# Patient Record
Sex: Female | Born: 1965 | Race: White | Hispanic: No | Marital: Married | Smoking: Current every day smoker
Health system: Southern US, Community
[De-identification: ages and names within clinical notes are randomized; demographics above are authoritative.]

## PROBLEM LIST (undated history)

## (undated) DIAGNOSIS — K259 Gastric ulcer, unspecified as acute or chronic, without hemorrhage or perforation: Secondary | ICD-10-CM

## (undated) DIAGNOSIS — M797 Fibromyalgia: Secondary | ICD-10-CM

## (undated) DIAGNOSIS — G43909 Migraine, unspecified, not intractable, without status migrainosus: Secondary | ICD-10-CM

## (undated) DIAGNOSIS — I10 Essential (primary) hypertension: Secondary | ICD-10-CM

## (undated) HISTORY — DX: Gastric ulcer, unspecified as acute or chronic, without hemorrhage or perforation: K25.9

## (undated) HISTORY — DX: Essential (primary) hypertension: I10

---

## 2000-09-02 ENCOUNTER — Ambulatory Visit (HOSPITAL_COMMUNITY): Admission: RE | Admit: 2000-09-02 | Discharge: 2000-09-02 | Payer: Self-pay | Admitting: Obstetrics and Gynecology

## 2000-09-24 DIAGNOSIS — I10 Essential (primary) hypertension: Secondary | ICD-10-CM

## 2000-09-24 HISTORY — DX: Essential (primary) hypertension: I10

## 2001-09-23 ENCOUNTER — Other Ambulatory Visit: Admission: RE | Admit: 2001-09-23 | Discharge: 2001-09-23 | Payer: Self-pay | Admitting: Obstetrics and Gynecology

## 2002-09-24 HISTORY — PX: SALPINGOOPHORECTOMY: SHX82

## 2004-01-05 ENCOUNTER — Other Ambulatory Visit: Admission: RE | Admit: 2004-01-05 | Discharge: 2004-01-05 | Payer: Self-pay | Admitting: Obstetrics & Gynecology

## 2004-01-11 ENCOUNTER — Encounter: Admission: RE | Admit: 2004-01-11 | Discharge: 2004-01-11 | Payer: Self-pay | Admitting: Obstetrics & Gynecology

## 2004-02-15 ENCOUNTER — Ambulatory Visit (HOSPITAL_COMMUNITY): Admission: RE | Admit: 2004-02-15 | Discharge: 2004-02-15 | Payer: Self-pay | Admitting: Obstetrics & Gynecology

## 2005-06-06 ENCOUNTER — Ambulatory Visit: Payer: Self-pay | Admitting: Gastroenterology

## 2005-06-13 ENCOUNTER — Ambulatory Visit: Payer: Self-pay | Admitting: Gastroenterology

## 2005-06-22 ENCOUNTER — Ambulatory Visit (HOSPITAL_COMMUNITY): Admission: RE | Admit: 2005-06-22 | Discharge: 2005-06-22 | Payer: Self-pay | Admitting: Gastroenterology

## 2005-07-02 ENCOUNTER — Ambulatory Visit (HOSPITAL_COMMUNITY): Admission: RE | Admit: 2005-07-02 | Discharge: 2005-07-02 | Payer: Self-pay | Admitting: Gastroenterology

## 2005-07-24 ENCOUNTER — Ambulatory Visit: Payer: Self-pay | Admitting: Gastroenterology

## 2005-07-28 ENCOUNTER — Observation Stay (HOSPITAL_COMMUNITY): Admission: RE | Admit: 2005-07-28 | Discharge: 2005-07-28 | Payer: Self-pay | Admitting: General Surgery

## 2005-08-08 ENCOUNTER — Other Ambulatory Visit: Admission: RE | Admit: 2005-08-08 | Discharge: 2005-08-08 | Payer: Self-pay | Admitting: Obstetrics & Gynecology

## 2006-02-16 ENCOUNTER — Emergency Department (HOSPITAL_COMMUNITY): Admission: EM | Admit: 2006-02-16 | Discharge: 2006-02-16 | Payer: Self-pay | Admitting: Emergency Medicine

## 2006-06-12 ENCOUNTER — Ambulatory Visit (HOSPITAL_COMMUNITY): Admission: RE | Admit: 2006-06-12 | Discharge: 2006-06-13 | Payer: Self-pay | Admitting: Orthopaedic Surgery

## 2006-08-27 ENCOUNTER — Encounter: Admission: RE | Admit: 2006-08-27 | Discharge: 2006-08-27 | Payer: Self-pay | Admitting: Orthopaedic Surgery

## 2006-08-27 IMAGING — CT CT CERVICAL SPINE W/O CM
4 series · 16 of 33 positions shown, 19 images · IV contrast (agent unspecified)
Comparison: [REDACTED] intraoperative cervical spine radiographs [DATE].

CLINICAL DATA: Post fusion.  Possible pseudoarthrosis C5-6. 
CT CERVICAL SPINE WITHOUT CONTRAST:
TECHNIQUE: Multidetector CT imaging of the cervical spine was performed.  Multiplanar CT  image reconstructions were also generated.

[Series 2: cervical spine · axial · 0.23mm/px · z∈[+3,+121]mm · 5 of 142 slices shown, 7 images]
[im 24/142  soft-tissue]
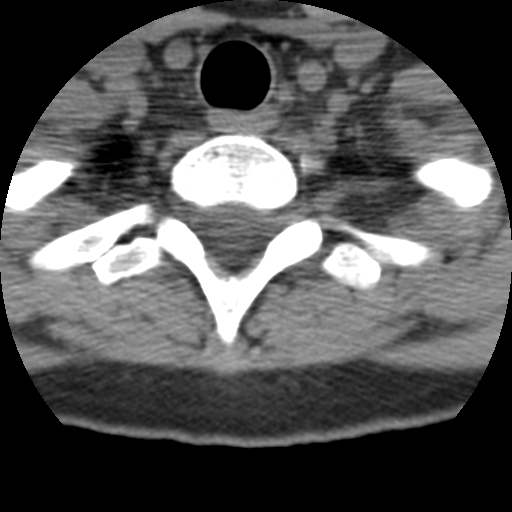
[im 24/142  bone]
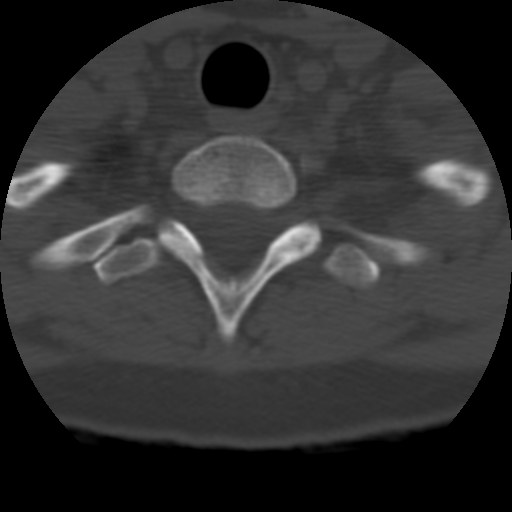
[im 48/142  bone]
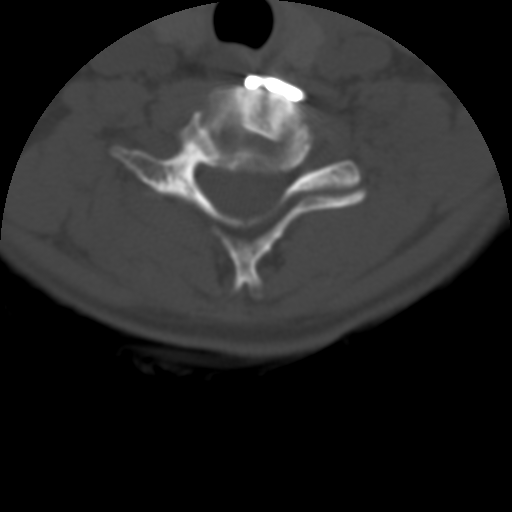
[im 71/142  bone]
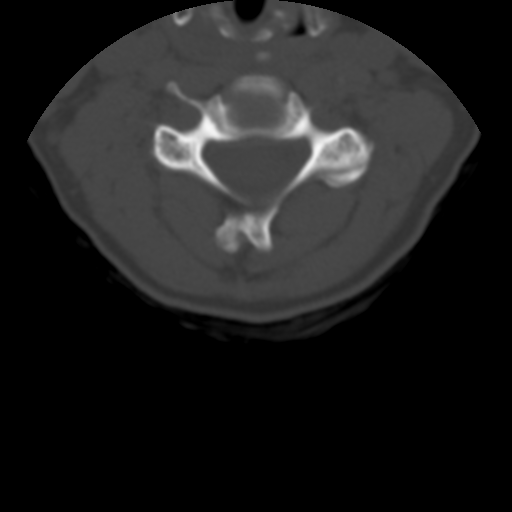
[im 95/142  bone]
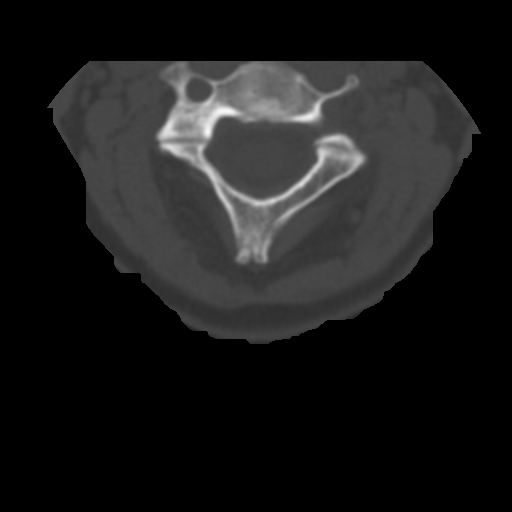
[im 118/142  soft-tissue]
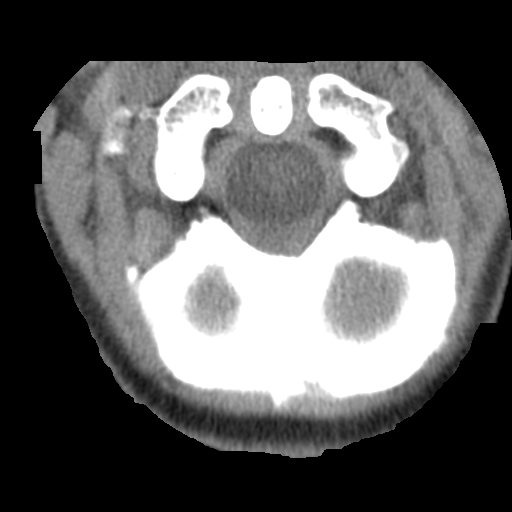
[im 118/142  bone]
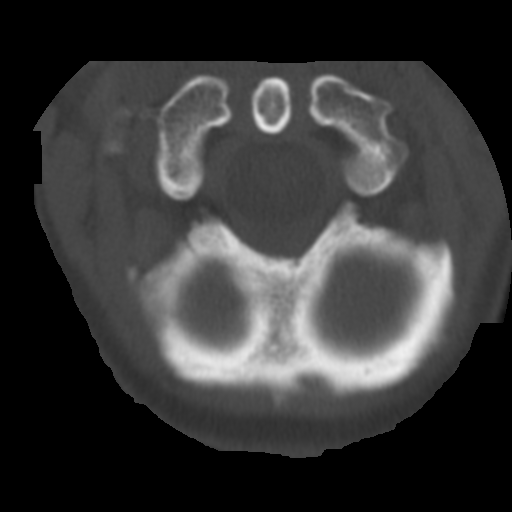

[Series 3: bone windows · axial · 0.23mm/px · z∈[+3,+62]mm · 3 of 142 slices shown]
[im 24/142  bone]
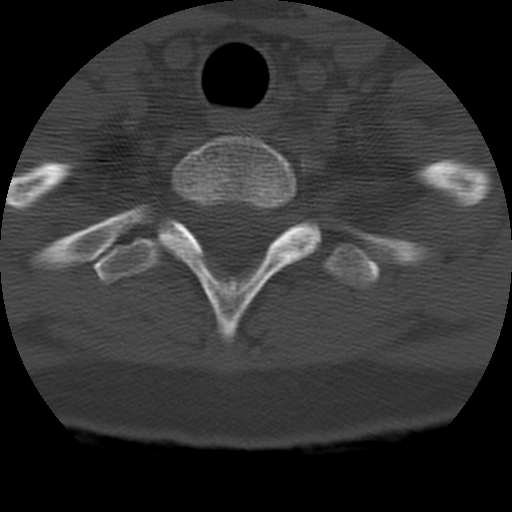
[im 48/142  bone]
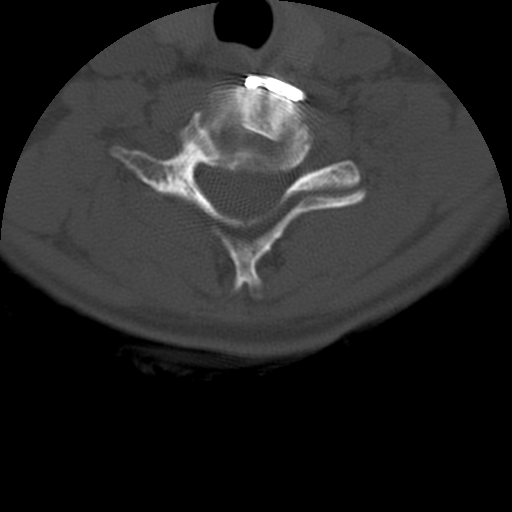
[im 71/142  bone]
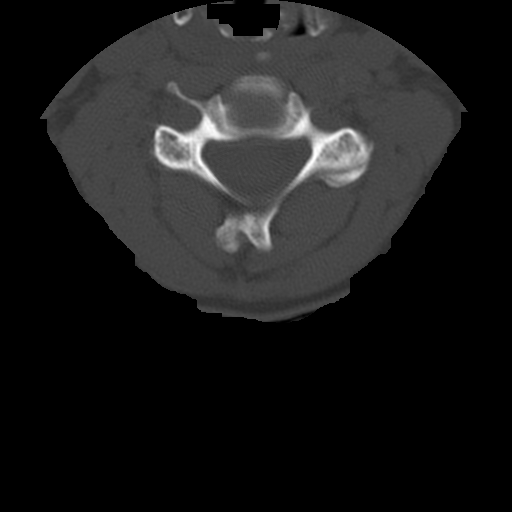

[Series 400: sag · sagittal · 0.35mm/px · 5 of 40 slices shown, 6 images]
[im 14/40  bone]
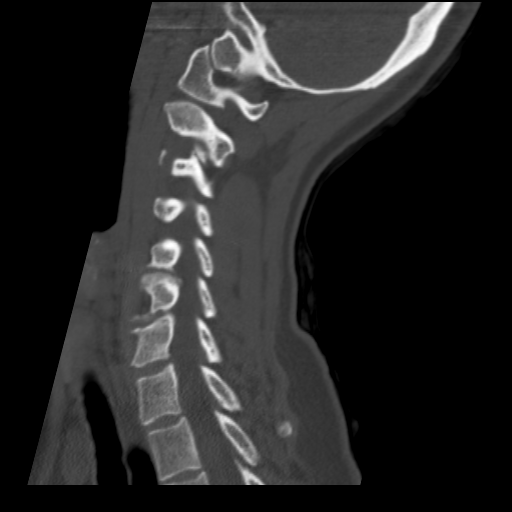
[im 17/40  bone]
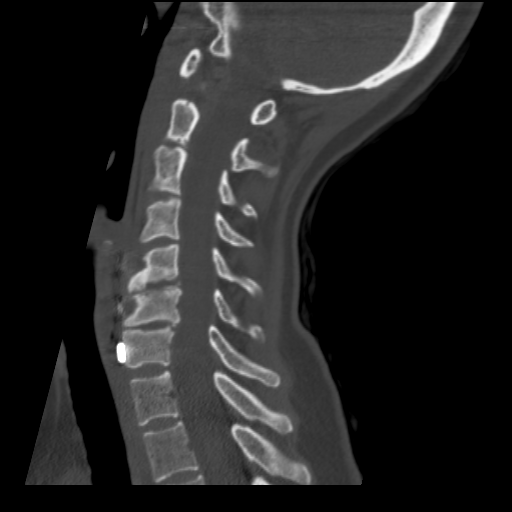
[im 20/40  soft-tissue]
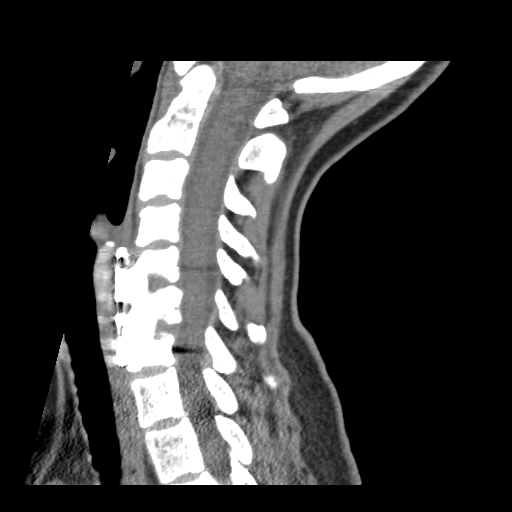
[im 20/40  bone]
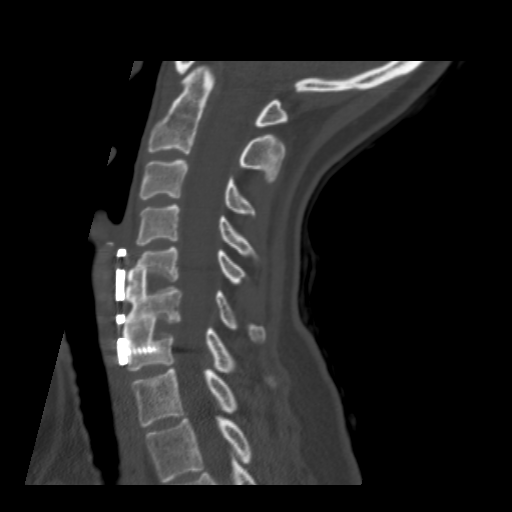
[im 23/40  bone]
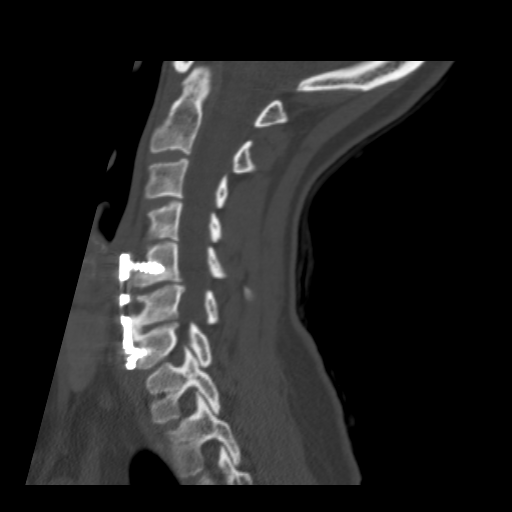
[im 27/40  bone]
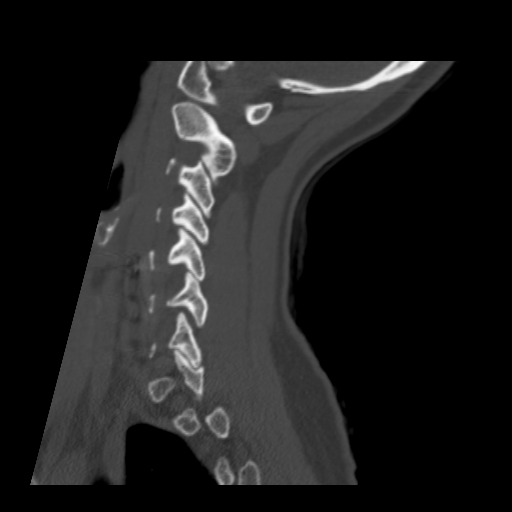

[Series 401: cor · coronal · 0.35mm/px · 3 of 40 slices shown]
[im 8/40  bone]
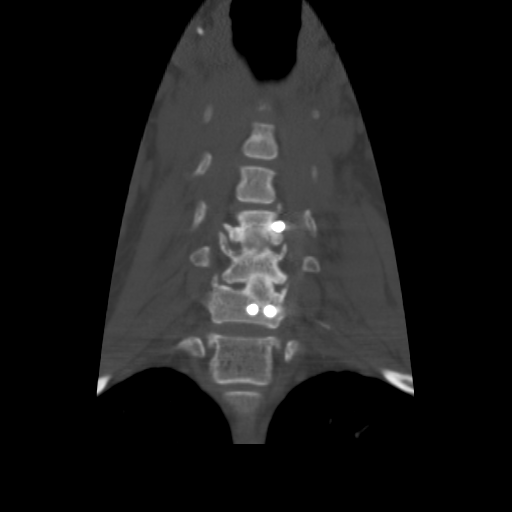
[im 16/40  bone]
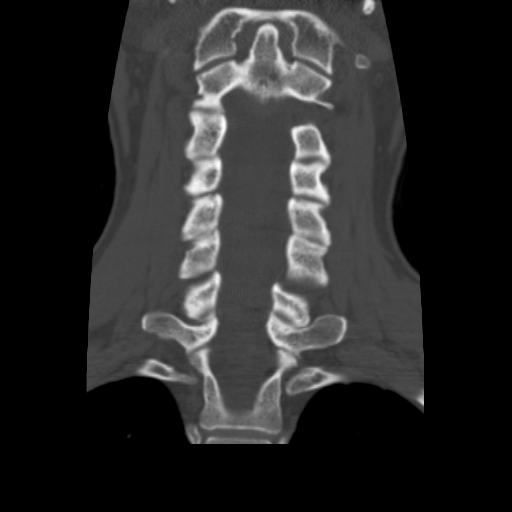
[im 24/40  bone]
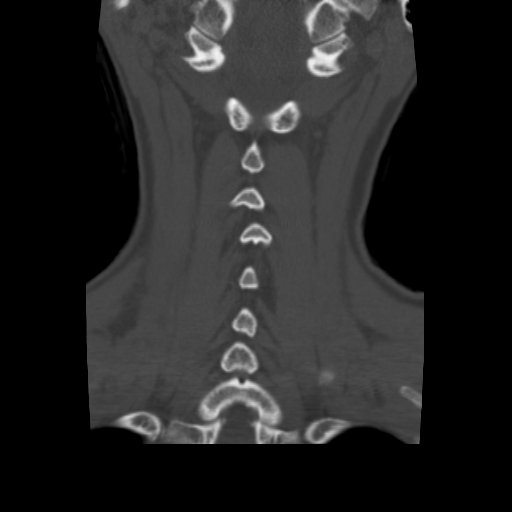

[16 of 33 positions shown; findings below may reference images not displayed]

FINDINGS: Base of the skull, C1-2, C2-3, C3-4, C4-5:  No significant neural impingement, acquired spinal stenosis, acquired spinal stenosis, nor significant disc bulge/herniation is seen.  
C5-6 and C6-7:  Anterior discectomy fusion changes demonstrate solid osseous fusion at the anterior aspect of the disc spaces with interbody bone plugs.  No surrounding osteolysis/loosening is seen at the two C7 and singular left C5 screws.  The anterior metal plate is flush with the anterior C7 vertebrae with up to 2mm gap at the anterior mid C6 and 4mm gap at the mid to superior C5 vertebrae.  The mid to posterior C5-6 and C6-7 discs are unfused.  AP dimension of the central spinal canal at the inferior C6 level measures 11mm (axial image 95).  No other neural impingement/acquired spinal stenosis is seen at these levels.  
C7-T1, T1-2, T2-3, no significant neural impingement nor acquired spinal stenosis is seen.
IMPRESSION: 1.  Anterior cervical discectomy post fusion change at C5-6 and C6-7 with osseous fusion of interbody bone plugs and anterior disc spaces.  Mid to posterior C5-6 and C6-7 disc spaces are unfused.  2mm gap at C6 and 4mm gap at the superior C5 vertebral body metal plate interface noted with no evidence for loosening of metal hardware screws. 
2.  Otherwise no significant abnormality.

## 2006-09-09 ENCOUNTER — Ambulatory Visit (HOSPITAL_COMMUNITY): Admission: RE | Admit: 2006-09-09 | Discharge: 2006-09-10 | Payer: Self-pay | Admitting: Orthopaedic Surgery

## 2007-09-25 DIAGNOSIS — G43909 Migraine, unspecified, not intractable, without status migrainosus: Secondary | ICD-10-CM

## 2007-09-25 DIAGNOSIS — M797 Fibromyalgia: Secondary | ICD-10-CM

## 2007-09-25 HISTORY — DX: Fibromyalgia: M79.7

## 2007-09-25 HISTORY — PX: CERVICAL FUSION: SHX112

## 2007-09-25 HISTORY — PX: CHOLECYSTECTOMY: SHX55

## 2007-09-25 HISTORY — DX: Migraine, unspecified, not intractable, without status migrainosus: G43.909

## 2009-07-15 ENCOUNTER — Emergency Department: Payer: Self-pay | Admitting: Emergency Medicine

## 2011-11-25 ENCOUNTER — Emergency Department (HOSPITAL_COMMUNITY)
Admission: EM | Admit: 2011-11-25 | Discharge: 2011-11-25 | Disposition: A | Discharge status: Home / Self Care | Payer: Self-pay | Attending: Emergency Medicine | Admitting: Emergency Medicine

## 2011-11-25 ENCOUNTER — Encounter (HOSPITAL_COMMUNITY): Payer: Self-pay

## 2011-11-25 DIAGNOSIS — R112 Nausea with vomiting, unspecified: Secondary | ICD-10-CM | POA: Insufficient documentation

## 2011-11-25 DIAGNOSIS — R51 Headache: Secondary | ICD-10-CM

## 2011-11-25 DIAGNOSIS — F17200 Nicotine dependence, unspecified, uncomplicated: Secondary | ICD-10-CM | POA: Insufficient documentation

## 2011-11-25 HISTORY — DX: Migraine, unspecified, not intractable, without status migrainosus: G43.909

## 2011-11-25 HISTORY — DX: Fibromyalgia: M79.7

## 2011-11-25 MED ORDER — MORPHINE SULFATE 4 MG/ML IJ SOLN
4.0000 mg | Freq: Once | INTRAMUSCULAR | Status: AC
  Administered 2011-11-25: 4 mg via INTRAVENOUS
  Filled 2011-11-25: qty 1

## 2011-11-25 MED ORDER — OXYCODONE-ACETAMINOPHEN 5-325 MG PO TABS: 1.0000 | ORAL_TABLET | Freq: Four times a day (QID) | ORAL | Status: AC | PRN

## 2011-11-25 MED ORDER — METOCLOPRAMIDE HCL 5 MG/ML IJ SOLN
10.0000 mg | Freq: Once | INTRAMUSCULAR | Status: AC
  Administered 2011-11-25: 10 mg via INTRAVENOUS
  Filled 2011-11-25: qty 2

## 2011-11-25 MED ORDER — SODIUM CHLORIDE 0.9 % IV BOLUS (SEPSIS)
1000.0000 mL | Freq: Once | INTRAVENOUS | Status: AC
  Administered 2011-11-25: 1000 mL via INTRAVENOUS

## 2011-11-25 MED ORDER — KETOROLAC TROMETHAMINE 30 MG/ML IJ SOLN
30.0000 mg | Freq: Once | INTRAMUSCULAR | Status: AC
  Administered 2011-11-25: 30 mg via INTRAVENOUS
  Filled 2011-11-25: qty 1

## 2011-11-25 NOTE — ED Notes (Signed)
Pt presents with "Migraine" and vomiting since last night.

## 2011-11-25 NOTE — ED Provider Notes (Signed)
History   This chart was scribed for Benny Lennert, MD by Charolett Bumpers . The patient was seen in room APA12/APA12 and the patient's care was started at 7:52pm.   CSN: [id number]  Arrival date & time 11/25/11  1903   First MD Initiated Contact with Patient 11/25/11 1951      Chief Complaint  Patient presents with  . Migraine  . Emesis    (Consider location/radiation/quality/duration/timing/severity/associated sxs/prior Treatment) Shelly Owens is a 46 y.o. female who presents to the Emergency Department complaining of constant, severe headache with associated nausea and vomiting since last night. Patient states that she has had similar episodes previously, with her last episode being about 5 years ago.   Patient is a 46 y.o. female presenting with headaches. The history is provided by the patient.  Headache  This is a new problem. The current episode started yesterday. The problem occurs constantly. The problem has not changed since onset.The headache is associated with nothing. The quality of the pain is described as sharp. The pain is severe. The pain does not radiate. Associated symptoms include nausea and vomiting. She has tried nothing for the symptoms.    Past Medical History  Diagnosis Date  . Fibromyalgia   . Migraines     Past Surgical History  Procedure Date  . Cholecystectomy   . Salpingoophorectomy   . Cervical fusion     No family history on file.  History  Substance Use Topics  . Smoking status: Current Everyday Smoker -- 1.0 packs/day  . Smokeless tobacco: Not on file  . Alcohol Use: No    OB History    Grav Para Term Preterm Abortions TAB SAB Ect Mult Living                  Review of Systems  Constitutional: Negative for fatigue.  HENT: Negative for congestion, sinus pressure and ear discharge.   Eyes: Negative for discharge.  Respiratory: Negative for cough.   Cardiovascular: Negative for chest pain.  Gastrointestinal: Positive for  nausea and vomiting. Negative for abdominal pain and diarrhea.  Genitourinary: Negative for frequency and hematuria.  Musculoskeletal: Negative for back pain.  Skin: Negative for rash.  Neurological: Positive for headaches. Negative for seizures.  Hematological: Negative.   Psychiatric/Behavioral: Negative for hallucinations.  All other systems reviewed and are negative.    Allergies  Codeine; Dilaudid; and Phenergan  Home Medications  No current outpatient prescriptions on file.  BP 109/85  Pulse 84  Temp(Src) 97.9 F (36.6 C) (Oral)  Resp 22  Ht 5\' 2"  (1.575 m)  Wt 113 lb (51.256 kg)  BMI 20.67 kg/m2  SpO2 100%  Physical Exam  Constitutional: She is oriented to person, place, and time. She appears well-developed.  HENT:  Head: Normocephalic and atraumatic.  Eyes: EOM are normal. Pupils are equal, round, and reactive to light. No scleral icterus.       Conjunctiva inflamed.    Neck: Normal range of motion. Neck supple. No thyromegaly present.  Cardiovascular: Normal rate, regular rhythm and normal heart sounds.  Exam reveals no gallop and no friction rub.   No murmur heard. Pulmonary/Chest: Effort normal and breath sounds normal. No stridor. She has no wheezes. She has no rales. She exhibits no tenderness.  Abdominal: Soft. Bowel sounds are normal. She exhibits no distension. There is no tenderness. There is no rebound.  Musculoskeletal: Normal range of motion. She exhibits no edema.  Lymphadenopathy:    She has no  cervical adenopathy.  Neurological: She is alert and oriented to person, place, and time. Coordination normal.  Skin: No rash noted. No erythema.  Psychiatric: She has a normal mood and affect. Her behavior is normal.    ED Course  Procedures (including critical care time)  DIAGNOSTIC STUDIES: Oxygen Saturation is 100% on room air, normal by my interpretation.    COORDINATION OF CARE:  1955: Discussed with patient the planned course of treatment.    2000: Medication Orders: Sodium chloride 0.9% bolus 1,000 mL-once; Morphine 4 mg/mL injection 4 mg-once; Ketorolac 30 mg/mL injection 30 mg-once; Metoclopramide injection 10 mg-once.  2105: Recheck: Patient states that her symptoms have improved.   Labs Reviewed - No data to display No results found.   No diagnosis found.    MDM  Migraine headache improved  The chart was scribed for me under my direct supervision.  I personally performed the history, physical, and medical decision making and all procedures in the evaluation of this patient.Benny Lennert, MD 11/25/11 2112

## 2011-11-25 NOTE — ED Notes (Signed)
Confirmed with EDP concerning giving Toradol, EDP states that pt did not have a true allergy

## 2012-01-18 ENCOUNTER — Encounter (HOSPITAL_COMMUNITY): Payer: Self-pay | Admitting: *Deleted

## 2012-01-18 ENCOUNTER — Emergency Department (HOSPITAL_COMMUNITY)
Admission: EM | Admit: 2012-01-18 | Discharge: 2012-01-18 | Disposition: A | Discharge status: Home / Self Care | Payer: Self-pay | Attending: Emergency Medicine | Admitting: Emergency Medicine

## 2012-01-18 DIAGNOSIS — T148XXA Other injury of unspecified body region, initial encounter: Secondary | ICD-10-CM

## 2012-01-18 DIAGNOSIS — S5010XA Contusion of unspecified forearm, initial encounter: Secondary | ICD-10-CM | POA: Insufficient documentation

## 2012-01-18 DIAGNOSIS — F17200 Nicotine dependence, unspecified, uncomplicated: Secondary | ICD-10-CM | POA: Insufficient documentation

## 2012-01-18 DIAGNOSIS — T50995A Adverse effect of other drugs, medicaments and biological substances, initial encounter: Secondary | ICD-10-CM | POA: Insufficient documentation

## 2012-01-18 DIAGNOSIS — G43909 Migraine, unspecified, not intractable, without status migrainosus: Secondary | ICD-10-CM | POA: Insufficient documentation

## 2012-01-18 NOTE — Discharge Instructions (Signed)

## 2012-01-18 NOTE — ED Provider Notes (Signed)
History     CSN: [id number]  Arrival date & time 01/18/12  4098   First MD Initiated Contact with Patient 01/18/12 [phone]      Chief Complaint  Patient presents with  . PPD Reading    (Consider location/radiation/quality/duration/timing/severity/associated sxs/prior treatment) HPI Comments: Presents for a PPD evaluation. The test was placed a local pharmacy on 01/15/12. She had red yesterday and it was read erroneously as positive. Presents for reevaluation.  Has area of bruising but has no induration.  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 No language interpreter was used.    Past Medical History  Diagnosis Date  . Fibromyalgia   . Migraines     Past Surgical History  Procedure Date  . Cholecystectomy   . Salpingoophorectomy   . Cervical fusion     No family history on file.  History  Substance Use Topics  . Smoking status: Current Everyday Smoker -- 1.0 packs/day  . Smokeless tobacco: Not on file  . Alcohol Use: No    OB History    Grav Para Term Preterm Abortions TAB SAB Ect Mult Living                  Review of Systems  Constitutional: Negative for fever, chills, activity change, appetite change and fatigue.  HENT: Negative for congestion, sore throat, rhinorrhea, neck pain and neck stiffness.   Respiratory: Negative for cough and shortness of breath.   Cardiovascular: Negative for chest pain and palpitations.  Gastrointestinal: Negative for nausea, vomiting and abdominal pain.  Genitourinary: Negative for dysuria, urgency, frequency and flank pain.  Musculoskeletal: Negative for myalgias, back pain and arthralgias.  Skin: Negative for color change and wound.  Neurological: Negative for dizziness, weakness, light-headedness, numbness and headaches.  All other systems reviewed and are negative.    Allergies  Aspirin; Codeine; Dilaudid; and Phenergan  Home Medications   Current Outpatient Rx    Name Route Sig Dispense Refill  . DIPHENHYDRAMINE HCL 25 MG PO TABS Oral Take 25 mg by mouth every 6 (six) hours as needed. For allergy    . ESTRADIOL 0.025 MG/24HR TD PTTW Transdermal Place 1 patch onto the skin 2 (two) times a week.    Marland Kitchen KETOPROFEN 75 MG PO CAPS Oral Take 75 mg by mouth 2 (two) times daily as needed. For migraines    . TRAMADOL HCL 50 MG PO TABS Oral Take 50 mg by mouth every 6 (six) hours as needed. For migraines      BP 146/92  Pulse 67  Temp(Src) 98.9 F (37.2 C) (Oral)  Resp 18  Wt 110 lb (49.896 kg)  SpO2 100%  Physical Exam  Nursing note and vitals reviewed. Constitutional: She is oriented to person, place, and time. She appears well-developed and well-nourished.  HENT:  Head: Normocephalic and atraumatic.  Mouth/Throat: Oropharynx is clear and moist.  Eyes: Conjunctivae and EOM are normal. Pupils are equal, round, and reactive to light.  Neck: Normal range of motion. Neck supple.  Cardiovascular: Normal rate, regular rhythm, normal heart sounds and intact distal pulses.  Exam reveals no gallop and no friction rub.   No murmur heard. Pulmonary/Chest: Effort normal and breath sounds normal. No respiratory distress. She exhibits no tenderness.  Abdominal: Soft. Bowel sounds are normal. There is no tenderness.  Musculoskeletal: Normal range of motion. She exhibits no tenderness.  Neurological: She is alert and oriented to person, place, and time.  Skin:       Present over the left forearm at the PPD site. There is no evidence of induration    ED Course  Procedures (including critical care time)  Labs Reviewed - No data to display No results found.   1. Contusion       MDM  PPD was read personally by me. There is no evidence of induration to suggest a positive reaction.  Consistent with a bruise.  No need for cxr or abx        Dayton Bailiff, MD 01/18/12 1006

## 2012-01-18 NOTE — ED Notes (Signed)
Pt states "I had a ppd test @ [facility], the pharmacist did it incorrectly and then recorded it was (+)."

## 2012-12-10 ENCOUNTER — Ambulatory Visit: Payer: Self-pay | Admitting: Adult Health

## 2012-12-30 ENCOUNTER — Ambulatory Visit: Payer: Self-pay | Admitting: Adult Health

## 2013-01-05 ENCOUNTER — Ambulatory Visit: Payer: Self-pay | Admitting: Adult Health

## 2013-01-08 ENCOUNTER — Ambulatory Visit (INDEPENDENT_AMBULATORY_CARE_PROVIDER_SITE_OTHER): Payer: Self-pay | Admitting: Adult Health

## 2013-01-08 ENCOUNTER — Encounter: Payer: Self-pay | Admitting: Adult Health

## 2013-01-08 VITALS — BP 112/82 | HR 62 | Temp 98.1°F | Resp 14 | Ht 62.5 in | Wt 123.5 lb

## 2013-01-08 DIAGNOSIS — Z0000 Encounter for general adult medical examination without abnormal findings: Secondary | ICD-10-CM

## 2013-01-08 DIAGNOSIS — Z1239 Encounter for other screening for malignant neoplasm of breast: Secondary | ICD-10-CM

## 2013-01-08 DIAGNOSIS — R5381 Other malaise: Secondary | ICD-10-CM

## 2013-01-08 DIAGNOSIS — R5383 Other fatigue: Secondary | ICD-10-CM

## 2013-01-08 NOTE — Progress Notes (Signed)
Subjective:    Patient ID: Shelly Owens, female    DOB: 1966-03-31, 47 y.o.   MRN: [id number]  HPI  Patient presents to clinic to establish care. Previously followed by Dr. Bethann Punches at [facility]. She has not seen a PCP in several years. She is feeling overall well at present. She reports having daily fatigue.   Past Medical History  Diagnosis Date  . Fibromyalgia 2009    Diagnosed by Dr. Loleta Chance  . Migraines 2009    Evaluated by Dr. Loleta Chance  . Gastric ulcer     As a child  . Hypertension 2002    Diagnosed by Dr. Hyacinth Meeker    Family History  Problem Relation Age of Onset  . Diabetes Mother   . COPD Mother   . Hypothyroidism Mother   . Diabetes Father   . Hypertension Sister   . Diabetes Sister   . Seizures Sister   . COPD Sister   . Hypothyroidism Sister   . Diabetes Brother 65    Complications from uncontrolled diabetes  . Alcohol abuse Brother   . Seizures Daughter     History   Social History  . Marital Status: Married    Spouse Name: Johnny    Number of Children: 3  . Years of Education: 14   Occupational History  . CNA     [employer]   Social History Main Topics  . Smoking status: Current Every Day Smoker -- 1.00 packs/day for 15 years    Types: Cigarettes  . Smokeless tobacco: Not on file  . Alcohol Use: No  . Drug Use: No  . Sexually Active: Yes   Other Topics Concern  . Not on file   Social History Narrative   Patient lives at home with her husband of 26 years, daughter and her fiance. 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 Patient has 3 adult children (2 sons, 1 dtr) and 2 grandchildren.     Health Maintenance:  Tdap - 2012  Flu shot - 06/2012  PAP - Abnormal in 2000. Did not follow up. GYN in [place]. She does not remember his name.  Mammography - 3 years ago. Needs  Dexa Scan - 3 years ago. Normal  Colonoscopy - N/A  Labs - 2-3 years ago. Will need  Depression Screen - Patient is  not depressed. She denies loss of interest in usual activities, feelings of hopelessness or anhedonia.  Tobacco Use - Current smoker 1ppd. Started at age 39. Quit intermittently for 14 years.   Dental Exams - Needs  Vision Exam - Needs  Exercise - 3 mile trail, zumba, swim in summer  Diet - She tries to follow a clean diet - Lean meats, healthy fats, fruits & vegetables    Review of Systems  Constitutional: Positive for appetite change and fatigue. Negative for fever and chills.       Weight gain 10 lbs within last 8 months  HENT: Positive for postnasal drip and tinnitus.        Tinnitus - ongoing for ~ 4-5 years.  Eyes: Negative for pain.       Burning eyes, straining  Respiratory: Positive for cough and shortness of breath. Negative for wheezing.   Cardiovascular: Positive for palpitations.       Hand and legs swell; palpitations related to anxiety  Gastrointestinal: Positive for diarrhea. Negative for nausea, vomiting, constipation, blood in stool and anal bleeding.  Diarrhea 2/2 cholecystectomy  Endocrine: Positive for cold intolerance and heat intolerance.       Hot flashes ~ 4 years  Genitourinary: Positive for pelvic pain and dyspareunia. Negative for dysuria, frequency, hematuria, vaginal bleeding, vaginal discharge and difficulty urinating.       Pain during intercourse but not afterward. Vaginal dryness  Musculoskeletal: Positive for back pain and arthralgias.  Skin: Negative for wound.       Dry skin  Allergic/Immunologic: Positive for environmental allergies.       Seasonal allergies. Takes claritin D  Neurological: Positive for weakness, numbness and headaches. Negative for seizures.       Left hand, pinky tingling  Hematological: Negative for adenopathy. Does not bruise/bleed easily.  Psychiatric/Behavioral: Positive for sleep disturbance. Negative for suicidal ideas, behavioral problems, confusion, self-injury and agitation. The patient is nervous/anxious.      BP 112/82  Pulse 62  Temp(Src) 98.1 F (36.7 C) (Oral)  Resp 14  Ht 5' 2.5" (1.588 m)  Wt 123 lb 8 oz (56.019 kg)  BMI 22.21 kg/m2  SpO2 95%    Objective:   Physical Exam  Constitutional: She is oriented to person, place, and time. She appears well-developed and well-nourished. No distress.  HENT:  Head: Normocephalic and atraumatic.  Right Ear: External ear normal.  Left Ear: External ear normal.  Nose: Nose normal.  Mouth/Throat: Oropharynx is clear and moist.  Eyes: Conjunctivae and EOM are normal. Pupils are equal, round, and reactive to light.  Neck: Normal range of motion. Neck supple.  Cardiovascular: Normal rate, regular rhythm, normal heart sounds and intact distal pulses.  Exam reveals no gallop and no friction rub.   No murmur heard. Pulmonary/Chest: Effort normal and breath sounds normal. No respiratory distress. She has no wheezes. She has no rales.  Abdominal: Soft. Bowel sounds are normal. She exhibits no distension and no mass. There is no tenderness. There is no rebound and no guarding.  Musculoskeletal: Normal range of motion. She exhibits no edema and no tenderness.  Lymphadenopathy:    She has no cervical adenopathy.  Neurological: She is alert and oriented to person, place, and time. No cranial nerve deficit. Coordination normal.  Skin: Skin is warm and dry.  Psychiatric: She has a normal mood and affect. Her behavior is normal. Judgment and thought content normal.          Assessment & Plan:  ]]]

## 2013-01-08 NOTE — Patient Instructions (Addendum)
  Thank you for choosing [facility] for your Health Care needs.  Please return in the next few days for your fasting labs.  Schedule your Pelvic including a PAP as soon as possible.  I have ordered your Mammogram. The office will contact you with an appoint.

## 2013-01-09 DIAGNOSIS — Z0000 Encounter for general adult medical examination without abnormal findings: Secondary | ICD-10-CM | POA: Insufficient documentation

## 2013-01-09 NOTE — Assessment & Plan Note (Signed)
Physical exam was normal. Will order labs: cbc w/diff, cmet, lipids, TSH. Patient will need to return for fasting labs. Also, she needs a return appointment for a PAP smear. She reports an abnormal without follow up. Will also order Mammogram.

## 2013-01-15 ENCOUNTER — Other Ambulatory Visit (INDEPENDENT_AMBULATORY_CARE_PROVIDER_SITE_OTHER): Payer: Self-pay

## 2013-01-15 DIAGNOSIS — R5383 Other fatigue: Secondary | ICD-10-CM

## 2013-01-15 DIAGNOSIS — Z0000 Encounter for general adult medical examination without abnormal findings: Secondary | ICD-10-CM

## 2013-01-15 DIAGNOSIS — R5381 Other malaise: Secondary | ICD-10-CM

## 2013-01-15 LAB — COMPREHENSIVE METABOLIC PANEL
Albumin: 4.2 g/dL (ref 3.5–5.2)
BUN: 22 mg/dL (ref 6–23)
CO2: 27 mEq/L (ref 19–32)
Calcium: 9.2 mg/dL (ref 8.4–10.5)
Chloride: 104 mEq/L (ref 96–112)
Creatinine, Ser: 1 mg/dL (ref 0.4–1.2)
GFR: 61.22 mL/min (ref >60.00)
Glucose, Bld: 99 mg/dL (ref 70–99)
Potassium: 4.6 mEq/L (ref 3.5–5.1)

## 2013-01-15 LAB — CBC WITH DIFFERENTIAL/PLATELET
Basophils Absolute: 0 10*3/uL (ref 0.0–0.1)
Basophils Relative: 1 % (ref 0.0–3.0)
Eosinophils Absolute: 0.1 10*3/uL (ref 0.0–0.7)
Eosinophils Relative: 2.9 % (ref 0.0–5.0)
HCT: 42.3 % (ref 36.0–46.0)
Lymphocytes Relative: 34.9 % (ref 12.0–46.0)
Lymphs Abs: 1.7 10*3/uL (ref 0.7–4.0)
MCHC: 34.1 g/dL (ref 30.0–36.0)
MCV: 94 fl (ref 78.0–100.0)
Monocytes Absolute: 0.4 10*3/uL (ref 0.1–1.0)
Monocytes Relative: 7.9 % (ref 3.0–12.0)
Neutro Abs: 2.7 10*3/uL (ref 1.4–7.7)
Neutrophils Relative %: 53.3 % (ref 43.0–77.0)
Platelets: 330 10*3/uL (ref 150.0–400.0)
RBC: 4.5 Mil/uL (ref 3.87–5.11)
RDW: 12.4 % (ref 11.5–14.6)
WBC: 5 10*3/uL (ref 4.5–10.5)

## 2013-01-15 LAB — LIPID PANEL
Cholesterol: 200 mg/dL (ref 0–200)
LDL Cholesterol: 121 mg/dL — ABNORMAL HIGH (ref 0–99)
Triglycerides: 135 mg/dL (ref 0.0–149.0)

## 2013-01-15 LAB — LDL CHOLESTEROL, DIRECT: Direct LDL: 120.2 mg/dL

## 2013-01-15 LAB — TSH: TSH: 1.73 u[IU]/mL (ref 0.35–5.50)

## 2013-01-20 ENCOUNTER — Ambulatory Visit: Payer: Self-pay | Admitting: Adult Health

## 2013-01-21 ENCOUNTER — Other Ambulatory Visit: Payer: Self-pay

## 2013-01-22 ENCOUNTER — Ambulatory Visit: Payer: Self-pay | Admitting: Adult Health

## 2013-02-09 ENCOUNTER — Ambulatory Visit (INDEPENDENT_AMBULATORY_CARE_PROVIDER_SITE_OTHER): Payer: BC Managed Care – PPO | Admitting: Adult Health

## 2013-02-09 ENCOUNTER — Encounter: Payer: Self-pay | Admitting: Adult Health

## 2013-02-09 ENCOUNTER — Other Ambulatory Visit (HOSPITAL_COMMUNITY)
Admission: RE | Admit: 2013-02-09 | Discharge: 2013-02-09 | Disposition: A | Discharge status: Home / Self Care | Payer: BC Managed Care – PPO | Source: Ambulatory Visit | Attending: Adult Health | Admitting: Adult Health

## 2013-02-09 ENCOUNTER — Other Ambulatory Visit: Payer: Self-pay | Admitting: *Deleted

## 2013-02-09 VITALS — BP 110/76 | HR 67 | Resp 12 | Wt 125.5 lb

## 2013-02-09 DIAGNOSIS — Z87898 Personal history of other specified conditions: Secondary | ICD-10-CM

## 2013-02-09 DIAGNOSIS — A499 Bacterial infection, unspecified: Secondary | ICD-10-CM

## 2013-02-09 DIAGNOSIS — B9689 Other specified bacterial agents as the cause of diseases classified elsewhere: Secondary | ICD-10-CM

## 2013-02-09 DIAGNOSIS — Z8742 Personal history of other diseases of the female genital tract: Secondary | ICD-10-CM

## 2013-02-09 DIAGNOSIS — Z1151 Encounter for screening for human papillomavirus (HPV): Secondary | ICD-10-CM | POA: Insufficient documentation

## 2013-02-09 DIAGNOSIS — Z01419 Encounter for gynecological examination (general) (routine) without abnormal findings: Secondary | ICD-10-CM | POA: Insufficient documentation

## 2013-02-09 DIAGNOSIS — Z124 Encounter for screening for malignant neoplasm of cervix: Secondary | ICD-10-CM

## 2013-02-09 DIAGNOSIS — N760 Acute vaginitis: Secondary | ICD-10-CM

## 2013-02-09 MED ORDER — METRONIDAZOLE 0.75 % VA GEL: VAGINAL | Status: DC

## 2013-02-09 MED ORDER — KETOPROFEN 50 MG PO CAPS: 50.0000 mg | ORAL_CAPSULE | Freq: Four times a day (QID) | ORAL | Status: DC | PRN

## 2013-02-09 MED ORDER — CLONAZEPAM 1 MG PO TABS: 1.0000 mg | ORAL_TABLET | Freq: Every evening | ORAL | Status: DC | PRN

## 2013-02-09 NOTE — Patient Instructions (Addendum)
  I will contact you with the results of your PAP and culture.  Start Metrogel vaginally daily x 5 days.

## 2013-02-09 NOTE — Addendum Note (Signed)
Addended by: Montine Circle D on: 02/09/2013 10:01 AM   Modules accepted: Orders

## 2013-02-09 NOTE — Telephone Encounter (Signed)
Refill

## 2013-02-09 NOTE — Progress Notes (Signed)
  Subjective:    Patient ID: Shelly Owens, female    DOB: August 23, 1966, 47 y.o.   MRN: [id number]  HPI  Patient is a pleasant 47 y/o female with hx of abnormal PAP in 2000 without follow up. She presents to clinic today for a PAP smear.     Current Outpatient Prescriptions on File Prior to Visit  Medication Sig Dispense Refill  . clonazePAM (KLONOPIN) 1 MG tablet Take 1 mg by mouth at bedtime as needed for anxiety.      Marland Kitchen ibuprofen (ADVIL,MOTRIN) 800 MG tablet Take 800 mg by mouth every 8 (eight) hours as needed for pain.      Marland Kitchen metoCLOPramide (REGLAN) 5 MG tablet Take 5 mg by mouth daily as needed.      Maxwell Caul Bicarbonate (ZEGERID) 20-1100 MG CAPS Take 1 capsule by mouth daily.      . traMADol (ULTRAM) 50 MG tablet Take 50 mg by mouth every 6 (six) hours as needed. For migraines      . zolmitriptan (ZOMIG-ZMT) 5 MG disintegrating tablet Take 5 mg by mouth as needed for migraine.      Marland Kitchen estradiol (VIVELLE-DOT) 0.05 MG/24HR Place 1 patch onto the skin 2 (two) times a week.       No current facility-administered medications on file prior to visit.     Review of Systems  Genitourinary: Negative for vaginal bleeding, vaginal discharge and dyspareunia.    Resp 12  Wt 125 lb 8 oz (56.926 kg)  BMI 22.57 kg/m2    Objective:   Physical Exam  Abdominal: Hernia confirmed negative in the right inguinal area and confirmed negative in the left inguinal area.  Genitourinary: Uterus normal.    No labial fusion. There is no rash, tenderness, lesion or injury on the right labia. There is no rash, tenderness, lesion or injury on the left labia. No erythema, tenderness or bleeding around the vagina. No foreign body around the vagina. Vaginal discharge found.       Assessment & Plan:

## 2013-02-09 NOTE — Addendum Note (Signed)
Addended by: Montine Circle D on: 02/09/2013 05:04 PM   Modules accepted: Orders

## 2013-02-09 NOTE — Assessment & Plan Note (Addendum)
Hx of abnormal PAP without follow up. PAP done today.

## 2013-02-09 NOTE — Assessment & Plan Note (Addendum)
PAP smear done. Culture also sent for suspected BV. Treat with metrogel daily x 5 days

## 2013-02-10 LAB — WET PREP BY MOLECULAR PROBE
Gardnerella vaginalis: NEGATIVE
Trichomonas vaginosis: NEGATIVE

## 2013-02-10 NOTE — Telephone Encounter (Signed)
Rx faxed to pharmacy  

## 2013-02-12 ENCOUNTER — Encounter: Payer: Self-pay | Admitting: *Deleted

## 2013-03-12 ENCOUNTER — Other Ambulatory Visit: Payer: Self-pay | Admitting: Adult Health

## 2013-03-13 NOTE — Telephone Encounter (Signed)
OK to refill

## 2013-04-08 ENCOUNTER — Other Ambulatory Visit: Payer: Self-pay | Admitting: Adult Health

## 2013-04-08 NOTE — Telephone Encounter (Signed)
Yes, ok to refill 

## 2013-04-08 NOTE — Telephone Encounter (Signed)
Ok to refill 

## 2013-05-04 ENCOUNTER — Other Ambulatory Visit: Payer: Self-pay | Admitting: *Deleted

## 2013-05-04 NOTE — Telephone Encounter (Signed)
I will refill at appt tomorrow.

## 2013-05-04 NOTE — Telephone Encounter (Signed)
Pt is needing refill on Tramadol.

## 2013-05-04 NOTE — Telephone Encounter (Signed)
Refill? Has appt tomorrow

## 2013-05-05 ENCOUNTER — Telehealth: Payer: Self-pay | Admitting: Adult Health

## 2013-05-05 ENCOUNTER — Ambulatory Visit: Payer: BC Managed Care – PPO | Admitting: Adult Health

## 2013-05-05 NOTE — Telephone Encounter (Signed)
Spoke with pt, has office visit scheduled for tomorrow. States takes Ketoprofen 1 tab every morning and requesting refill on Tramadol to help with her migraine.  Per Raquel, no refill on Tramadol until she sees her in the office tomorrow. Pt notified. Advised to take another Ketoprofen as Rx is written for 4 times daily prn.

## 2013-05-05 NOTE — Telephone Encounter (Signed)
Pt came at the wrong time today and says she is needing a refill on her Tramadol she is having bad migraines. Pt is scheduled for tomorrow at 3:30 pm

## 2013-05-06 ENCOUNTER — Encounter: Payer: Self-pay | Admitting: Adult Health

## 2013-05-06 ENCOUNTER — Encounter: Payer: Self-pay | Admitting: *Deleted

## 2013-05-06 ENCOUNTER — Ambulatory Visit (INDEPENDENT_AMBULATORY_CARE_PROVIDER_SITE_OTHER): Payer: Self-pay | Admitting: Adult Health

## 2013-05-06 VITALS — BP 128/80 | HR 60 | Temp 98.3°F | Resp 12 | Wt 124.0 lb

## 2013-05-06 DIAGNOSIS — G43909 Migraine, unspecified, not intractable, without status migrainosus: Secondary | ICD-10-CM

## 2013-05-06 MED ORDER — GABAPENTIN 100 MG PO CAPS: ORAL_CAPSULE | ORAL | Status: DC

## 2013-05-06 MED ORDER — KETOPROFEN 50 MG PO CAPS: ORAL_CAPSULE | ORAL | Status: DC

## 2013-05-06 MED ORDER — CLONAZEPAM 1 MG PO TABS: 1.0000 mg | ORAL_TABLET | Freq: Every evening | ORAL | Status: DC | PRN

## 2013-05-06 MED ORDER — TRAMADOL HCL 50 MG PO TABS: 50.0000 mg | ORAL_TABLET | Freq: Four times a day (QID) | ORAL | Status: DC | PRN

## 2013-05-06 MED ORDER — ELETRIPTAN HYDROBROMIDE 40 MG PO TABS: 40.0000 mg | ORAL_TABLET | ORAL | Status: DC | PRN

## 2013-05-06 NOTE — Progress Notes (Signed)
Subjective:    Patient ID: Shelly Owens, female    DOB: April 16, 1966, 47 y.o.   MRN: [id number]  HPI  Shelly Owens is a pleasant 47 y/o female with history of fibromyalgia, migraine headaches who presents to clinic with c/o daily headaches and pain "everwhere". Shelly Owens has been having these symptoms since 2008. Symptoms have not been well controlled. Shelly Owens has neck pain and is s/p cervical fusion (2009). Shelly Owens has been seen at the headache clinic in [place]. Shelly Owens underwent multiple treatment but Shelly Owens felt the medications were too strong and sedating. Shelly Owens that Shelly Owens has taken several medications for migraine prevention but side effects were too uncomfortable. Shelly Owens has been prescribed Topamax in the past but Shelly Owens became "spacey". Shelly Owens was also prescribed amitriptyline but this made her hallucinate. Her husband presents to clinic with her today and confirms [name]. Shelly Owens has been prescribed zomig in the past for migraines but this medication became too expensive. Shelly Owens also Owens waiting until HA was severe before taking which then, Shelly Owens found, would take longer to alleviate migraine. Shelly Owens is currently taking (per her report) ibuprofen OTC in addition to ketoprofen. Shelly Owens states that Shelly Owens had only been prescribed 30 tablets of ketoprofen even though the prescription read that Shelly Owens could take 4 times daily prn. So Shelly Owens was compensating by taking ibuprofen. Shelly Owens is also taking tamadol 1 tablet daily for her neck pain. In addition, Shelly Owens has also been taking BC powders.   Fadia first came to our office in 12/2012. All of the medications Shelly Owens was on were prescribed from a previous provider. Shelly Owens exercises daily but recently her headaches have been so debilitating that Shelly Owens has not been able to work out.  Current Outpatient Prescriptions on File Prior to Visit  Medication Sig Dispense Refill  . estradiol (VIVELLE-DOT) 0.05 MG/24HR Place 1 patch onto the skin 2 (two) times a week.      . metoCLOPramide (REGLAN) 5 MG tablet Take  5 mg by mouth daily as needed.      Maxwell Caul Bicarbonate (ZEGERID) 20-1100 MG CAPS Take 1 capsule by mouth daily.       No current facility-administered medications on file prior to visit.    Review of Systems  Constitutional: Positive for fatigue. Negative for fever and chills.  Respiratory: Negative.   Cardiovascular: Negative.   Musculoskeletal:       "pain everywhere".   Neurological: Positive for headaches. Negative for weakness and numbness.       Recently, having daily headache  Psychiatric/Behavioral: Negative for hallucinations, behavioral problems, confusion and agitation. The patient is nervous/anxious.        Objective:   Physical Exam  Constitutional: Shelly Owens is oriented to person, place, and time. Shelly Owens appears well-developed and well-nourished.  Patient crying  HENT:  Head: Normocephalic and atraumatic.  Right Ear: External ear normal.  Left Ear: External ear normal.  Mouth/Throat: Oropharynx is clear and moist.  Eyes: Conjunctivae and EOM are normal. Pupils are equal, round, and reactive to light.  Neck: Normal range of motion. Neck supple. No tracheal deviation present.  Cardiovascular: Normal rate, regular rhythm and intact distal pulses.  Exam reveals no gallop and no friction rub.   No murmur heard. Pulmonary/Chest: Effort normal and breath sounds normal. No respiratory distress. Shelly Owens has no rales. Shelly Owens exhibits no tenderness.  Abdominal: Soft. Bowel sounds are normal.  Musculoskeletal: Normal range of motion. Shelly Owens exhibits tenderness. Shelly Owens exhibits no edema.  Tenderness in palpating bilateral elbow joints. No edema. ROM  normal.  Neurological: Shelly Owens is alert and oriented to person, place, and time. Shelly Owens has normal reflexes.  Skin: Skin is warm and dry.  Psychiatric: Her behavior is normal. Judgment and thought content normal.  Crying throughout visit. Noted to be frustrated with her current situation of daily headaches.          Assessment & Plan:

## 2013-05-06 NOTE — Patient Instructions (Addendum)
  I am starting you on Neurontin (gabapentin) for migraine prevention. Start 100 mg at bedtime x 3 days. Then take 200 mg at bedtime.  Do not take ibuprofen, advil, aleve, motrin or BC powders.  I have refilled ketoprofen which is an anti-inflammatory.  I have also refilled your tramadol.  I have also given you samples of relpax for migraine headaches. Take 1 tablet at the first sign of a headache. You can repeat the dose in 2 hours if the migraine has not improved. You can also try taking only 1/2 the dose (1/2 tablet) to see if the lower dose will also work for you.  Keep a food diary and a diary of your migraines.  Please return in 2-3 weeks to evaluate how your headaches are doing.

## 2013-05-07 ENCOUNTER — Encounter: Payer: Self-pay | Admitting: Adult Health

## 2013-05-07 DIAGNOSIS — G43909 Migraine, unspecified, not intractable, without status migrainosus: Secondary | ICD-10-CM | POA: Insufficient documentation

## 2013-05-07 NOTE — Assessment & Plan Note (Addendum)
Patient is not currently on any migraine prevention. Given the degree of disability experienced with daily migraine, the focus needs to be on trying to find prophylactic treatment suitable for her. Discussed keeping a food diary so that she may be able to identify triggers. I will not start her on a beta blocker 2/2 her smoking and her low pulse. She appears to be very sensitive to medications so the dosages will initially be very low. We discussed the goal of therapy to be the lowest dose that will prevent migraines for her. She did not do well with topamax or amitriptyline. She is agreeable to try neurontin which may also help with her fibromyalgia symptoms. Start 100 mg at bedtime for 3 days then increase to 200 mg at bedtime. She will return for follow up in 2-3 weeks to discuss progress. I have also told her that she needs to stop taking BC powders. She has a hx of gastric ulcers and the [facility] powders are very harsh on the stomach lining. She also needs to avoid over use of OTC anti-inflammatories such as advil. I have explained to her that ketoprofen is a NSAID as well as the ibuprofen, advil etc. By taking too much NSAIDs she may be triggering rebound HA. I have provided her with samples of relpax and instructed to take at the onset of a migraine attack. If this helps her I will send in prescription. Note, greater than 40 min were spent in face to face communication with patient in the assessment, evaluation of previous tx, planning and implementation of care pertaining to this problem.

## 2013-05-14 ENCOUNTER — Other Ambulatory Visit: Payer: Self-pay | Admitting: Adult Health

## 2013-05-14 NOTE — Telephone Encounter (Signed)
Spoke with pharmacist, pt did not drop off written Rx from 05/06/13. Phoned in refill per Raquel Rey's note 05/06/13.

## 2013-05-18 ENCOUNTER — Encounter: Payer: Self-pay | Admitting: Adult Health

## 2013-05-20 ENCOUNTER — Ambulatory Visit: Payer: Self-pay | Admitting: Adult Health

## 2013-05-26 ENCOUNTER — Ambulatory Visit: Payer: Self-pay | Admitting: Adult Health

## 2013-06-16 ENCOUNTER — Telehealth: Payer: Self-pay | Admitting: *Deleted

## 2013-06-16 MED ORDER — TRAMADOL HCL 50 MG PO TABS: 50.0000 mg | ORAL_TABLET | Freq: Four times a day (QID) | ORAL | Status: DC | PRN

## 2013-06-16 NOTE — Telephone Encounter (Signed)
Pt called, stating [facility] [place] needed a new Rx for her Tramadol. Spoke with Brett Canales, pharmacist at [facility], since the change in Class of Tramadol, even though pt shows refills available, a new Rx needs to faxed in. Rx printed and given to Raquel for signature. Pt scheduled followup appointment 06/18/13.

## 2013-06-16 NOTE — Telephone Encounter (Signed)
Rx faxed and pt notified Rx was faxed.

## 2013-06-18 ENCOUNTER — Encounter: Payer: Self-pay | Admitting: Adult Health

## 2013-06-18 ENCOUNTER — Ambulatory Visit (INDEPENDENT_AMBULATORY_CARE_PROVIDER_SITE_OTHER): Payer: Self-pay | Admitting: Adult Health

## 2013-06-18 VITALS — BP 138/84 | HR 61 | Temp 97.8°F | Resp 12 | Wt 132.0 lb

## 2013-06-18 DIAGNOSIS — G43909 Migraine, unspecified, not intractable, without status migrainosus: Secondary | ICD-10-CM

## 2013-06-18 MED ORDER — DULOXETINE HCL 30 MG PO CPEP: 30.0000 mg | ORAL_CAPSULE | Freq: Two times a day (BID) | ORAL | Status: DC

## 2013-06-18 MED ORDER — ELETRIPTAN HYDROBROMIDE 40 MG PO TABS: 40.0000 mg | ORAL_TABLET | ORAL | Status: DC | PRN

## 2013-06-18 NOTE — Assessment & Plan Note (Addendum)
Continue Relpax for migraine abortive tx. Discontinue neurontin as patient did not tolerate. Start Cymbalta 30 mg daily x1 week. May increase to 60 mg daily starting week 2. We have not been able to find any medication that will help with her symptoms. If Cymbalta works discussed weaning her off other medications. She may also need referral to specialist for her multiple sites of pain. Return for followup in 2-3 months or sooner if necessary.

## 2013-06-18 NOTE — Patient Instructions (Addendum)
  Start Cymbalta 30 mg daily for 1 week. If this dose works for you then continue the 30 mg. If you feel you need a little more after one week then increase dose to 60 mg daily.  If this helps, we will begin to wean you off of the other medications.  Please call if you experience any problems or side effects.

## 2013-06-18 NOTE — Progress Notes (Signed)
  Subjective:    Patient ID: Shelly Owens, female    DOB: 09-Nov-1965, 47 y.o.   MRN: [id number]  HPI  Patient is a pleasant 47 year old female who presents to clinic for followup on migraine headaches. She was seen in clinic last month and started on Neurontin for preventative therapy. She did not do well with Neurontin. She reports side effects of feeling that she was walking on pebbles. Medication has been discontinued. She presents to clinic today and has an ongoing migraine. She has been using Relpax as abortive therapy. She still reports pain everywhere. She has not been able to return to the gym and is concerned because she is gaining weight.  Current Outpatient Prescriptions on File Prior to Visit  Medication Sig Dispense Refill  . clonazePAM (KLONOPIN) 1 MG tablet TAKE ONE TABLET BY MOUTH ONCE DAILY AT BEDTIME AS NEEDED FOR ANXIETY  30 tablet  2  . estradiol (VIVELLE-DOT) 0.05 MG/24HR Place 1 patch onto the skin 2 (two) times a week.      . ketoprofen (ORUDIS) 50 MG capsule Take 1 tablet twice a day as needed.  60 capsule  1  . metoCLOPramide (REGLAN) 5 MG tablet Take 5 mg by mouth daily as needed.      Maxwell Caul Bicarbonate (ZEGERID) 20-1100 MG CAPS Take 1 capsule by mouth daily.      . traMADol (ULTRAM) 50 MG tablet Take 1 tablet (50 mg total) by mouth every 6 (six) hours as needed. For migraines  30 tablet  2   No current facility-administered medications on file prior to visit.     Review of Systems  Respiratory: Negative.   Cardiovascular: Negative.   Gastrointestinal: Negative.   Genitourinary: Negative.   Musculoskeletal: Positive for myalgias.       Neck pain  Neurological: Positive for headaches.  Psychiatric/Behavioral: Negative for hallucinations, behavioral problems, confusion and agitation.        Objective:   Physical Exam  Constitutional: She is oriented to person, place, and time. She appears well-developed and well-nourished.  Appears  uncomfortable  HENT:  Head: Normocephalic and atraumatic.  Eyes: Conjunctivae and EOM are normal. Pupils are equal, round, and reactive to light.  Cardiovascular: Normal rate, regular rhythm, normal heart sounds and intact distal pulses.  Exam reveals no gallop and no friction rub.   No murmur heard. Pulmonary/Chest: Breath sounds normal. No respiratory distress. She has no wheezes. She has no rales.  Abdominal: Soft. Bowel sounds are normal.  Musculoskeletal: Normal range of motion. She exhibits tenderness. She exhibits no edema.  Patient has multiple sites of point tenderness - neck, shoulder, arms  Neurological: She is alert and oriented to person, place, and time.  Skin: Skin is warm and dry.  Psychiatric: She has a normal mood and affect. Her behavior is normal. Judgment and thought content normal.          Assessment & Plan:

## 2013-07-22 ENCOUNTER — Ambulatory Visit (INDEPENDENT_AMBULATORY_CARE_PROVIDER_SITE_OTHER): Payer: BC Managed Care – PPO | Admitting: Adult Health

## 2013-07-22 ENCOUNTER — Encounter: Payer: Self-pay | Admitting: Adult Health

## 2013-07-22 VITALS — BP 120/78 | HR 100 | Temp 97.4°F | Resp 12 | Wt 131.5 lb

## 2013-07-22 DIAGNOSIS — M542 Cervicalgia: Secondary | ICD-10-CM

## 2013-07-22 DIAGNOSIS — R200 Anesthesia of skin: Secondary | ICD-10-CM

## 2013-07-22 DIAGNOSIS — R209 Unspecified disturbances of skin sensation: Secondary | ICD-10-CM

## 2013-07-22 MED ORDER — KETOPROFEN 50 MG PO CAPS: ORAL_CAPSULE | ORAL | Status: DC

## 2013-07-22 MED ORDER — ELETRIPTAN HYDROBROMIDE 40 MG PO TABS: ORAL_TABLET | ORAL | Status: DC

## 2013-07-22 MED ORDER — CYCLOBENZAPRINE HCL 5 MG PO TABS: 5.0000 mg | ORAL_TABLET | Freq: Three times a day (TID) | ORAL | Status: AC | PRN

## 2013-07-22 NOTE — Assessment & Plan Note (Signed)
With decreased range of motion. Patient has history of cervical spondylosis status post cervical fusion in 2009. She is now experiencing numbness and tingling of bilateral upper extremities. The symptoms are transient and appear to be associated with certain neck movements. Patient can actually reproduce her symptoms. Patient needs to be evaluated by a neurosurgeon. I will send her for MRI of cervical spine prior to referral.

## 2013-07-22 NOTE — Progress Notes (Signed)
Subjective:    Patient ID: Shelly Owens, female    DOB: 17-Jan-1966, 47 y.o.   MRN: [id number]  HPI  Patient is a pleasant 47 year old female with history cervical spondylosis status post cervical fusion in 2009 who presents to clinic with complaints of neck pain, decreased range of motion, numbness and tingling bilateral upper extremities with certain movements of her neck. Patient reports that she can reproduce the numbness and tingling. She also has a history of frequent migraines. She takes Relpax and also takes ketoprofen with some relief. Patient reports that when she had her cervical fusion in 2009, the surgeon told her that she would more than likely experience similar symptoms in the lower part of the cervical spine.   Current Outpatient Prescriptions on File Prior to Visit  Medication Sig Dispense Refill  . clonazePAM (KLONOPIN) 1 MG tablet TAKE ONE TABLET BY MOUTH ONCE DAILY AT BEDTIME AS NEEDED FOR ANXIETY  30 tablet  2  . estradiol (VIVELLE-DOT) 0.05 MG/24HR Place 1 patch onto the skin 2 (two) times a week.      . metoCLOPramide (REGLAN) 5 MG tablet Take 5 mg by mouth daily as needed.      Maxwell Caul Bicarbonate (ZEGERID) 20-1100 MG CAPS Take 1 capsule by mouth daily.      . traMADol (ULTRAM) 50 MG tablet Take 1 tablet (50 mg total) by mouth every 6 (six) hours as needed. For migraines  30 tablet  2   No current facility-administered medications on file prior to visit.      Past Medical History  Diagnosis Date  . Fibromyalgia 2009    Diagnosed by Dr. Loleta Chance  . Migraines 2009    Evaluated by Dr. Loleta Chance  . Gastric ulcer     As a child  . Hypertension 2002    Diagnosed by Dr. Hyacinth Meeker     Past Surgical History  Procedure Laterality Date  . Cholecystectomy  2009  . Salpingoophorectomy Left 2004    left tube and ovary  . Cervical fusion  2009    Dr. Ophelia Charter     Family History  Problem Relation Age of Onset  . Diabetes Mother   . COPD Mother   .  Hypothyroidism Mother   . Diabetes Father   . Hypertension Sister   . Diabetes Sister   . Seizures Sister   . COPD Sister   . Hypothyroidism Sister   . Diabetes Brother 65    Complications from uncontrolled diabetes  . Alcohol abuse Brother   . Seizures Daughter      History   Social History  . Marital Status: Married    Spouse Name: Johnny    Number of Children: 3  . Years of Education: 14   Occupational History  . CNA     [employer]   Social History Main Topics  . Smoking status: Current Every Day Smoker -- 1.00 packs/day for 15 years    Types: Cigarettes  . Smokeless tobacco: Not on file  . Alcohol Use: No  . Drug Use: No  . Sexual Activity: Yes   Other Topics Concern  . Not on file   Social History Narrative   Patient lives at home with her husband of 26 years, daughter and her fiance. 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 Patient has 3 adult children (2 sons, 1 dtr) and 2 grandchildren.      Review of Systems  Musculoskeletal: Positive for back pain  and neck pain.       Upper back and shoulder pain/tightness mainly on the right side.  Neurological: Positive for numbness and headaches. Negative for dizziness and weakness.  Psychiatric/Behavioral: Negative.        Objective:   Physical Exam  Constitutional: She is oriented to person, place, and time. She appears well-developed and well-nourished. No distress.  Musculoskeletal: She exhibits tenderness. She exhibits no edema.       Right shoulder: She exhibits decreased range of motion and tenderness. She exhibits no swelling.       Cervical back: She exhibits decreased range of motion, tenderness, bony tenderness and pain.       Back:       Arms:      Right hand: Normal sensation noted. Normal strength noted.       Left hand: Normal. Normal sensation noted. Normal strength noted.  Decreased range of motion of neck.  Neurological: She is alert and oriented  to person, place, and time. She has normal strength. No cranial nerve deficit or sensory deficit.  Psychiatric: She has a normal mood and affect. Her behavior is normal. Judgment and thought content normal.    BP 120/78  Pulse 100  Temp(Src) 97.4 F (36.3 C) (Oral)  Resp 12  Wt 131 lb 8 oz (59.648 kg)  BMI 23.65 kg/m2  SpO2 99%        Assessment & Plan:

## 2013-08-04 ENCOUNTER — Ambulatory Visit: Payer: Self-pay | Admitting: Adult Health

## 2013-08-05 ENCOUNTER — Telehealth: Payer: Self-pay | Admitting: Adult Health

## 2013-08-05 ENCOUNTER — Other Ambulatory Visit: Payer: Self-pay | Admitting: Adult Health

## 2013-08-05 DIAGNOSIS — R202 Paresthesia of skin: Secondary | ICD-10-CM

## 2013-08-05 NOTE — Telephone Encounter (Signed)
Pt states the Flexeril is not helping.  Is asking for something stronger like hydrocodone.

## 2013-08-05 NOTE — Telephone Encounter (Signed)
Pt notified of results, pt has no preference for referral

## 2013-08-05 NOTE — Telephone Encounter (Signed)
MMR cervical spine shows areas of narrowing and possibly affecting nerves. I am referring you to a neurosurgeon for further evaluation. Does she have a preference of where she would like to go?

## 2013-08-06 ENCOUNTER — Other Ambulatory Visit: Payer: Self-pay | Admitting: Adult Health

## 2013-08-06 MED ORDER — HYDROCODONE-ACETAMINOPHEN 5-325 MG PO TABS: 1.0000 | ORAL_TABLET | Freq: Four times a day (QID) | ORAL | Status: DC | PRN

## 2013-08-06 NOTE — Telephone Encounter (Signed)
Left message for pt to return my call.

## 2013-08-06 NOTE — Telephone Encounter (Signed)
Pt notified Rx ready for pickup 

## 2013-08-06 NOTE — Telephone Encounter (Signed)
I will order short course of hydrocodone until she is evaluated by neurosurgery.

## 2013-08-10 ENCOUNTER — Other Ambulatory Visit: Payer: Self-pay | Admitting: Adult Health

## 2013-08-10 MED ORDER — CLONAZEPAM 1 MG PO TABS: ORAL_TABLET | ORAL | Status: DC

## 2013-08-10 MED ORDER — TRAMADOL HCL 50 MG PO TABS: 50.0000 mg | ORAL_TABLET | Freq: Four times a day (QID) | ORAL | Status: DC | PRN

## 2013-08-10 NOTE — Telephone Encounter (Signed)
Would you refill these with Raquel out?

## 2013-08-10 NOTE — Telephone Encounter (Signed)
clonazePAM (KLONOPIN) 1 MG tablet  traMADol (ULTRAM) 50 MG tablet

## 2013-08-11 NOTE — Telephone Encounter (Signed)
Rx faxed to pharmacy. Pt notified.

## 2013-08-18 ENCOUNTER — Encounter: Payer: Self-pay | Admitting: Adult Health

## 2013-10-06 ENCOUNTER — Encounter (INDEPENDENT_AMBULATORY_CARE_PROVIDER_SITE_OTHER): Payer: Self-pay

## 2013-10-06 ENCOUNTER — Ambulatory Visit (INDEPENDENT_AMBULATORY_CARE_PROVIDER_SITE_OTHER): Payer: BC Managed Care – PPO | Admitting: Adult Health

## 2013-10-06 ENCOUNTER — Encounter: Payer: Self-pay | Admitting: Adult Health

## 2013-10-06 VITALS — BP 122/80 | HR 78 | Temp 98.0°F | Resp 12 | Wt 134.0 lb

## 2013-10-06 DIAGNOSIS — Z79899 Other long term (current) drug therapy: Secondary | ICD-10-CM | POA: Insufficient documentation

## 2013-10-06 MED ORDER — ELETRIPTAN HYDROBROMIDE 40 MG PO TABS: ORAL_TABLET | ORAL | Status: DC

## 2013-10-06 MED ORDER — KETOPROFEN 50 MG PO CAPS: ORAL_CAPSULE | ORAL | Status: DC

## 2013-10-06 MED ORDER — CLONAZEPAM 1 MG PO TABS: ORAL_TABLET | ORAL | Status: DC

## 2013-10-06 MED ORDER — ESTRADIOL 0.05 MG/24HR TD PTTW: 1.0000 | MEDICATED_PATCH | TRANSDERMAL | Status: AC

## 2013-10-06 MED ORDER — TRAMADOL HCL 50 MG PO TABS: 50.0000 mg | ORAL_TABLET | Freq: Four times a day (QID) | ORAL | Status: DC | PRN

## 2013-10-06 NOTE — Progress Notes (Signed)
   Subjective:    Patient ID: Shelly Owens, female    DOB: 06-01-1966, 48 y.o.   MRN: [id number]  HPI  Hilda LiasMarie is a pleasant 48 y/o female who presents to clinic to refill medications. She reports no problems with any medication. Taking as prescribed.  Current Outpatient Prescriptions on File Prior to Visit  Medication Sig Dispense Refill  . cyclobenzaprine (FLEXERIL) 5 MG tablet Take 1 tablet (5 mg total) by mouth 3 (three) times daily as needed for muscle spasms.  30 tablet  1  . HYDROcodone-acetaminophen (NORCO/VICODIN) 5-325 MG per tablet Take 1 tablet by mouth every 6 (six) hours as needed.  60 tablet  0  . metoCLOPramide (REGLAN) 5 MG tablet Take 5 mg by mouth daily as needed.      Maxwell Caul. Omeprazole-Sodium Bicarbonate (ZEGERID) 20-1100 MG CAPS Take 1 capsule by mouth daily.       No current facility-administered medications on file prior to visit.     Review of Systems  Constitutional: Negative.   HENT: Negative.   Eyes: Negative.   Respiratory: Negative.   Cardiovascular: Negative.   Gastrointestinal: Negative.   Endocrine: Negative.   Genitourinary: Negative.   Musculoskeletal: Negative.   Skin: Negative.   Allergic/Immunologic: Negative.   Neurological: Negative.   Hematological: Negative.   Psychiatric/Behavioral: Negative.        Objective:   Physical Exam  Constitutional: She is oriented to person, place, and time. She appears well-developed and well-nourished. No distress.  HENT:  Head: Normocephalic and atraumatic.  Cardiovascular: Normal rate and regular rhythm.   Pulmonary/Chest: Effort normal. No respiratory distress.  Neurological: She is alert and oriented to person, place, and time.  Psychiatric: She has a normal mood and affect. Her behavior is normal. Judgment and thought content normal.   BP 122/80  Pulse 78  Temp(Src) 98 F (36.7 C) (Oral)  Resp 12  Wt 134 lb (60.782 kg)  SpO2 98%      Assessment & Plan:

## 2013-10-06 NOTE — Progress Notes (Signed)
Pre visit review using our clinic review tool, if applicable. No additional management support is needed unless otherwise documented below in the visit note. 

## 2013-10-06 NOTE — Assessment & Plan Note (Signed)
Medications refilled. No reported side effects.

## 2013-10-10 ENCOUNTER — Other Ambulatory Visit: Payer: Self-pay | Admitting: Internal Medicine

## 2013-10-10 NOTE — Telephone Encounter (Signed)
Okay to refill? 

## 2013-10-24 ENCOUNTER — Telehealth: Payer: Self-pay | Admitting: Adult Health

## 2013-10-24 NOTE — Telephone Encounter (Signed)
Relevant patient education assigned to patient using Emmi. ° °

## 2013-11-12 ENCOUNTER — Telehealth: Payer: Self-pay | Admitting: Adult Health

## 2013-11-12 NOTE — Telephone Encounter (Signed)
Pt states she has been trying to get with us for two weeks to get her medication:  ketoprofen 50 mg.  States she has contacted [facility] (ph: [phone])  but they are not working with her on this.  States R. Rey is aware.  Pt states she is out of the med and needs it asap.

## 2013-11-12 NOTE — Telephone Encounter (Signed)
[facility] with [place] at [facility], they have Rx on hold for pt, states they will get it ready for pt. Pt notified.

## 2013-11-26 ENCOUNTER — Ambulatory Visit (INDEPENDENT_AMBULATORY_CARE_PROVIDER_SITE_OTHER): Payer: BC Managed Care – PPO | Admitting: Adult Health

## 2013-11-26 ENCOUNTER — Encounter: Payer: Self-pay | Admitting: Adult Health

## 2013-11-26 VITALS — BP 122/86 | HR 64 | Resp 14 | Wt 134.0 lb

## 2013-11-26 DIAGNOSIS — G43909 Migraine, unspecified, not intractable, without status migrainosus: Secondary | ICD-10-CM

## 2013-11-26 MED ORDER — ISOMETHEPTENE-APAP-DICHLORAL 65-325-100 MG PO CAPS: 1.0000 | ORAL_CAPSULE | Freq: Four times a day (QID) | ORAL | Status: DC | PRN

## 2013-11-26 NOTE — Progress Notes (Signed)
Pre visit review using our clinic review tool, if applicable. No additional management support is needed unless otherwise documented below in the visit note. 

## 2013-11-26 NOTE — Progress Notes (Signed)
   Subjective:    Patient ID: Shelly Owens, female    DOB: [birth year], 48 y.o.   MRN: [id number]  HPI  Shelly Owens presents to clinic for medication change. She has been taking ketoprofen for migraine; however, this medication is being discontinued. She used to take Midrin but she reported they took medication off the market. This medication is available in generic brand. She is willing to try this medication again for migraine.  Current Outpatient Prescriptions on File Prior to Visit  Medication Sig Dispense Refill  . clonazePAM (KLONOPIN) 1 MG tablet TAKE ONE TABLET BY MOUTH ONCE DAILY AT BEDTIME AS NEEDED FOR ANXIETY  30 tablet  3  . cyclobenzaprine (FLEXERIL) 5 MG tablet Take 1 tablet (5 mg total) by mouth 3 (three) times daily as needed for muscle spasms.  30 tablet  1  . eletriptan (RELPAX) 40 MG tablet One tablet by mouth at onset of headache. May repeat in 2 hours if headache persists or recurs.  10 tablet  3  . estradiol (VIVELLE-DOT) 0.05 MG/24HR patch Place 1 patch (0.05 mg total) onto the skin 2 (two) times a week.  8 patch  5  . HYDROcodone-acetaminophen (NORCO/VICODIN) 5-325 MG per tablet Take 1 tablet by mouth every 6 (six) hours as needed.  60 tablet  0  . metoCLOPramide (REGLAN) 5 MG tablet Take 5 mg by mouth daily as needed.      Maxwell Caul. Omeprazole-Sodium Bicarbonate (ZEGERID) 20-1100 MG CAPS Take 1 capsule by mouth daily.      . traMADol (ULTRAM) 50 MG tablet TAKE ONE TABLET BY MOUTH EVERY 6 HOURS AS NEEDED FOR MIGRAINES  30 tablet  0   No current facility-administered medications on file prior to visit.    Review of Systems  Constitutional: Negative.   HENT: Negative.   Eyes: Negative.   Respiratory: Negative.   Cardiovascular: Negative.   Gastrointestinal: Negative.   Endocrine: Negative.   Genitourinary: Negative.   Musculoskeletal: Negative.   Skin: Negative.   Allergic/Immunologic: Negative.   Neurological: Negative.  Headaches: controlled. doing well.  Hematological:  Negative.   Psychiatric/Behavioral: Negative.        Objective:   Physical Exam  Constitutional: She is oriented to person, place, and time. She appears well-developed and well-nourished. No distress.  Cardiovascular: Normal rate and regular rhythm.   Pulmonary/Chest: Effort normal. No respiratory distress.  Musculoskeletal: Normal range of motion.  Neurological: She is alert and oriented to person, place, and time.  Psychiatric: She has a normal mood and affect. Her behavior is normal. Judgment and thought content normal.       Assessment & Plan:   1. Migraine headache Medication change needed since they are discontinuing ketorolac. Will send in prescription for isometheptene/dichloralphenazon/ acetaminophen which is generic for midrin and pt has taken in the past with good results. RTC prn.

## 2013-11-27 ENCOUNTER — Telehealth: Payer: Self-pay | Admitting: Adult Health

## 2013-11-27 NOTE — Telephone Encounter (Signed)
Relevant patient education assigned to patient using Emmi. ° °

## 2014-01-19 ENCOUNTER — Telehealth: Payer: Self-pay | Admitting: Adult Health

## 2014-01-19 NOTE — Telephone Encounter (Signed)
Pt called stating she needs refill of Tramadol.

## 2014-01-19 NOTE — Telephone Encounter (Signed)
Pt requesting Rx refill on Tramadol.

## 2014-01-19 NOTE — Telephone Encounter (Signed)
Ok to refill 

## 2014-01-20 MED ORDER — TRAMADOL HCL 50 MG PO TABS: ORAL_TABLET | ORAL | Status: DC

## 2014-01-20 NOTE — Telephone Encounter (Signed)
Rx printed and given to Raquel for signature

## 2014-01-20 NOTE — Telephone Encounter (Signed)
Rx faxed to Pharmacy.

## 2014-01-25 ENCOUNTER — Other Ambulatory Visit: Payer: Self-pay | Admitting: Adult Health

## 2014-01-25 NOTE — Telephone Encounter (Signed)
Pt states [facility] did not receive rx for Tramadol.  States she needs this asap, she is in pain.  Advised was faxed 4/29.  Please refax.

## 2014-01-25 NOTE — Telephone Encounter (Signed)
[facility]

## 2014-01-25 NOTE — Telephone Encounter (Signed)
Okay to refill? 

## 2014-01-26 ENCOUNTER — Telehealth: Payer: Self-pay

## 2014-01-26 NOTE — Telephone Encounter (Signed)
Spoke with patient regarding tramdol Rx. Patient thought [facility] was [facility]. I told patient that [facility] and was ready for pick up. Patient verbalized understanding and stated she wil go pick it up right now.

## 2014-02-18 ENCOUNTER — Other Ambulatory Visit: Payer: Self-pay | Admitting: Adult Health

## 2014-02-18 NOTE — Telephone Encounter (Signed)
Last OV 11/26/13, ok refill?

## 2014-03-09 ENCOUNTER — Other Ambulatory Visit: Payer: Self-pay

## 2014-03-09 ENCOUNTER — Other Ambulatory Visit: Payer: Self-pay | Admitting: Adult Health

## 2014-03-09 ENCOUNTER — Telehealth: Payer: Self-pay

## 2014-03-09 MED ORDER — CLONAZEPAM 1 MG PO TABS: ORAL_TABLET | ORAL | Status: DC

## 2014-03-09 NOTE — Telephone Encounter (Signed)
[facility]

## 2014-03-09 NOTE — Telephone Encounter (Signed)
Pharmacist called requesting Chlonazepam refill.  Last OV 3.5.15, last refill 5.15.15.  Please advise refill.

## 2014-03-09 NOTE — Telephone Encounter (Signed)
Ok to refill 

## 2014-03-09 NOTE — Telephone Encounter (Signed)
Patient requesting Rx refills for  Clonazepam 1mg  and Midrin 65-325-100mg . Its ok to refill? Please advise. Would like both sent to [facility].

## 2014-03-11 ENCOUNTER — Ambulatory Visit: Payer: BC Managed Care – PPO | Admitting: Adult Health

## 2014-03-23 ENCOUNTER — Ambulatory Visit (INDEPENDENT_AMBULATORY_CARE_PROVIDER_SITE_OTHER): Payer: BC Managed Care – PPO | Admitting: Adult Health

## 2014-03-23 ENCOUNTER — Encounter: Payer: Self-pay | Admitting: Adult Health

## 2014-03-23 VITALS — BP 130/76 | HR 73 | Temp 98.3°F | Resp 14 | Ht 62.5 in | Wt 132.8 lb

## 2014-03-23 DIAGNOSIS — M7989 Other specified soft tissue disorders: Secondary | ICD-10-CM

## 2014-03-23 DIAGNOSIS — R490 Dysphonia: Secondary | ICD-10-CM | POA: Insufficient documentation

## 2014-03-23 DIAGNOSIS — Z79899 Other long term (current) drug therapy: Secondary | ICD-10-CM

## 2014-03-23 DIAGNOSIS — F411 Generalized anxiety disorder: Secondary | ICD-10-CM

## 2014-03-23 LAB — BASIC METABOLIC PANEL
BUN: 14 mg/dL (ref 6–23)
CHLORIDE: 105 meq/L (ref 96–112)
CO2: 24 mEq/L (ref 19–32)
Calcium: 9.5 mg/dL (ref 8.4–10.5)
Creatinine, Ser: 1 mg/dL (ref 0.4–1.2)
GFR: 63.02 mL/min (ref >60.00)
Glucose, Bld: 92 mg/dL (ref 70–99)
POTASSIUM: 4.7 meq/L (ref 3.5–5.1)
SODIUM: 139 meq/L (ref 135–145)

## 2014-03-23 MED ORDER — BUSPIRONE HCL 5 MG PO TABS: 5.0000 mg | ORAL_TABLET | Freq: Two times a day (BID) | ORAL | Status: AC

## 2014-03-23 MED ORDER — HYDROCODONE-ACETAMINOPHEN 5-325 MG PO TABS: 1.0000 | ORAL_TABLET | Freq: Four times a day (QID) | ORAL | Status: DC | PRN

## 2014-03-23 NOTE — Patient Instructions (Signed)
  Please have labs drawn prior to leaving the office today.  We will contact you with the results once they are available.  I am also referring you to ENT for evaluation of your vocal cords for hoarseness and throat concerns.  I want you to start over the counter omeprazole 20 mg daily for 1 month.  Start BuSpar 5 mg twice a day.  Please return for followup in one month.

## 2014-03-23 NOTE — Progress Notes (Signed)
Patient ID: Shelly Owens, female   DOB: Nov 10, 1965, 48 y.o.   MRN: [id number]    Subjective:    Patient ID: Shelly Owens, female    DOB: Nov 10, 1965, 48 y.o.   MRN: [id number]  HPI  Pt is a pleasant 48 y/o female who presents to clinic with several concerns:  1) Hoarseness - she reports noticing this in the morning. Has some irritation of her throat when this is happening but does not feel sick. She is concerned that "something is happening to my vocal cords after my cervical sgy".   2) She feels more anxiety. Feels it is coming from different areas. She reports that she is having marital problems. Has not considered any counseling. She has also been very nervous about her symptoms above and worrying about this being a result of her surgery.   3) Ongoing pain. Has hx of fibromyalgia. Also thinks that her symptoms are exacerbated by her increasing anxiety. She is requesting refill on her hydrocodone.  4) Swelling in her lower extremities. Concerned that this may be problems with her kidneys. She has changed her diet and eating very "clean". No processed foods.   Past Medical History  Diagnosis Date  . Fibromyalgia 2009    Diagnosed by Dr. Loleta ChanceHill  . Migraines 2009    Evaluated by Dr. Loleta ChanceHill  . Gastric ulcer     As a child  . Hypertension 2002    Diagnosed by Dr. Hyacinth MeekerMiller    Current Outpatient Prescriptions on File Prior to Visit  Medication Sig Dispense Refill  . clonazePAM (KLONOPIN) 1 MG tablet TAKE ONE TABLET BY MOUTH ONCE DAILY AT BEDTIME AS NEEDED FOR ANXIETY  30 tablet  3  . cyclobenzaprine (FLEXERIL) 5 MG tablet Take 1 tablet (5 mg total) by mouth 3 (three) times daily as needed for muscle spasms.  30 tablet  1  . eletriptan (RELPAX) 40 MG tablet One tablet by mouth at onset of headache. May repeat in 2 hours if headache persists or recurs.  10 tablet  3  . estradiol (VIVELLE-DOT) 0.05 MG/24HR patch Place 1 patch (0.05 mg total) onto the skin 2 (two) times a week.  8 patch  5  .  isometheptene-acetaminophen-dichloralphenazone (MIDRIN) 65-325-100 MG capsule TAKE 1 CAPSULE BY MOUTH 4 TIMES DAILY ASNEEDED FOR MIGRAINE. MAX OF 5CAPSULES IN 12 HOURS FOR MIGRAINE, 8 CAPSULES IN 24 HOURS FOR TENSION  30 capsule  3  . metoCLOPramide (REGLAN) 5 MG tablet Take 5 mg by mouth daily as needed.      Maxwell Caul. Omeprazole-Sodium Bicarbonate (ZEGERID) 20-1100 MG CAPS Take 1 capsule by mouth daily.      . traMADol (ULTRAM) 50 MG tablet TAKE ONE (1) TABLET EVERY 6 HOURS AS NEEDED FOR MIGRAINES  30 tablet  5   No current facility-administered medications on file prior to visit.     Review of Systems  Constitutional: Negative.  Negative for fever and fatigue.  HENT: Negative.   Eyes: Negative.   Respiratory: Negative.   Cardiovascular: Negative.   Gastrointestinal: Negative.   Endocrine: Negative.   Genitourinary: Negative.   Musculoskeletal: Positive for arthralgias and back pain.  Skin: Negative.   Allergic/Immunologic: Negative.   Neurological: Negative.   Hematological: Negative.   Psychiatric/Behavioral: Positive for sleep disturbance. Negative for suicidal ideas, confusion, self-injury and agitation. The patient is nervous/anxious.        Objective:  BP 130/76  Pulse 73  Temp(Src) 98.3 F (36.8 C) (Oral)  Resp 14  Ht  5' 2.5" (1.588 m)  Wt 132 lb 12 oz (60.215 kg)  BMI 23.88 kg/m2  SpO2 97%   Physical Exam  Constitutional: She is oriented to person, place, and time. No distress.  She is appropriately dressed but not as groomed as she usually is. Appears concerned.  HENT:  Head: Normocephalic and atraumatic.  Eyes: Conjunctivae and EOM are normal.  Neck: Normal range of motion. Neck supple.  Cardiovascular: Normal rate, regular rhythm, normal heart sounds and intact distal pulses.  Exam reveals no gallop and no friction rub.   No murmur heard. Pulmonary/Chest: Effort normal and breath sounds normal. No respiratory distress. She has no wheezes. She has no rales.    Musculoskeletal: Normal range of motion.  Neurological: She is alert and oriented to person, place, and time. She has normal reflexes. Coordination normal.  Engaging in conversation. Good eye contact.  Skin: Skin is warm and dry.  Psychiatric: She has a normal mood and affect. Her behavior is normal. Judgment and thought content normal.      Assessment & Plan:   1. Generalized anxiety disorder Multiple issue going on with her personal life. Offered referral to therapist but she does not want at this time. Will start her on buspar 5 mg bid. She will return in 1 month for follow up  2. Swelling of limb Watch sodium in diet. Will check renal function. Swelling is mild. - Basic metabolic panel  3. Hoarseness Hx of ulcers, GERD. She has not been taking any PPIs. Start omeprazole 20 mg daily x 1 month. I am also referring her to ENT for evaluation.  4. Medication management Refill medications. She takes her meds as prescribed.

## 2014-03-23 NOTE — Progress Notes (Signed)
Pre visit review using our clinic review tool, if applicable. No additional management support is needed unless otherwise documented below in the visit note. 

## 2014-07-08 ENCOUNTER — Telehealth: Payer: Self-pay

## 2014-07-08 ENCOUNTER — Other Ambulatory Visit: Payer: Self-pay | Admitting: *Deleted

## 2014-07-08 MED ORDER — CLONAZEPAM 1 MG PO TABS: ORAL_TABLET | ORAL | Status: DC

## 2014-07-08 NOTE — Telephone Encounter (Signed)
Ok refill? Last visit 03/23/14

## 2014-07-08 NOTE — Telephone Encounter (Signed)
Received refill request, see other encounter

## 2014-07-08 NOTE — Telephone Encounter (Signed)
The patient called and is hoping to get a refill on her klonopin   [facility]  [facility] -- [phone]  (her last ov with R.Rey was 03/23/14)

## 2014-07-09 NOTE — Telephone Encounter (Signed)
Faxed to pharmacy

## 2014-08-17 ENCOUNTER — Telehealth: Payer: Self-pay | Admitting: *Deleted

## 2014-08-17 MED ORDER — TRAMADOL HCL 50 MG PO TABS: ORAL_TABLET | ORAL | Status: DC

## 2014-08-17 NOTE — Telephone Encounter (Signed)
Refill request  Form raquel pt  Last Fill 10.29.2015  Tramadol HCL 50 mg

## 2014-08-17 NOTE — Telephone Encounter (Signed)
Rx faxed

## 2014-08-17 NOTE — Telephone Encounter (Signed)
Ok to refill,  printed rx  

## 2014-09-08 ENCOUNTER — Telehealth: Payer: Self-pay | Admitting: Nurse Practitioner

## 2014-09-08 NOTE — Telephone Encounter (Signed)
Was last seen by Raquel, 03/23/14, has not seen Lyla SonCarrie yet? Refill and schedule appt soon?

## 2014-09-08 NOTE — Telephone Encounter (Signed)
HYDROcodone-acetaminophen (NORCO/VICODIN) 5-325 MG  °

## 2014-09-08 NOTE — Telephone Encounter (Signed)
Left message for pt to return my call.

## 2014-09-08 NOTE — Telephone Encounter (Signed)
Patient called back and scheduled an apt for next week. Thanks!

## 2014-09-08 NOTE — Telephone Encounter (Signed)
I have not seen patient and refills have been given for multiple other pain and anxiety medications. She will need to make an appointment ASAP and please let her know I do not do pain management. Thank you!

## 2014-09-14 ENCOUNTER — Ambulatory Visit (INDEPENDENT_AMBULATORY_CARE_PROVIDER_SITE_OTHER): Payer: Self-pay | Admitting: Nurse Practitioner

## 2014-09-14 ENCOUNTER — Encounter: Payer: Self-pay | Admitting: Nurse Practitioner

## 2014-09-14 VITALS — BP 120/78 | HR 67 | Temp 98.5°F | Resp 12 | Ht 62.5 in | Wt 137.1 lb

## 2014-09-14 DIAGNOSIS — M2550 Pain in unspecified joint: Secondary | ICD-10-CM

## 2014-09-14 DIAGNOSIS — M5030 Other cervical disc degeneration, unspecified cervical region: Secondary | ICD-10-CM

## 2014-09-14 LAB — SEDIMENTATION RATE: SED RATE: 8 mm/h (ref 0–22)

## 2014-09-14 LAB — RHEUMATOID FACTOR

## 2014-09-14 MED ORDER — HYDROCODONE-ACETAMINOPHEN 5-325 MG PO TABS: 1.0000 | ORAL_TABLET | Freq: Four times a day (QID) | ORAL | Status: DC | PRN

## 2014-09-14 NOTE — Patient Instructions (Signed)
We will contact you with information regarding your referral to Dr. Ollen BowlHarkins.   We will follow up in 1 month.   Please visit lab before leaving today.

## 2014-09-14 NOTE — Progress Notes (Signed)
Pre visit review using our clinic review tool, if applicable. No additional management support is needed unless otherwise documented below in the visit note. 

## 2014-09-14 NOTE — Assessment & Plan Note (Signed)
Patient complains of generalized pain with several possible sources. Spine hurts the worst followed by shoulders then hips. Will obtain ESR, RF, Anti-CCP, ANA, and referral to Dr. Clydell Hakim (neuro pain management) for further work up.

## 2014-09-14 NOTE — Progress Notes (Signed)
Subjective:    Patient ID: Laray AngerMarie D Bromell, female    DOB: 10/29/1965, 48 y.o.   MRN: [id number]  HPI  Ms. Tory EmeraldChalk is a 48 yo female with pain after neck surgery 1 year ago.  1) Dr. Gerlene FeeKritzer did neck surgery over a year ago and a different level.   Takes Tramadol in the morning   Klonopin- High anxiety believed to be from pain she reports   Takes mid day  Hydrocodone- takes half, may take whole   Relpax- has not been able to financially afford  [facility]   Worst pain is located on spinal canal   Sees Dr. Loleta ChanceHill for Neurology.  Pt states she watches diet, no sugar she reports  7/10 without a migraine.   Massage therapy- Not helpful  Chiropractor- Not helpful  Acupuncture- Not helpful  Pain management- Not seeing currently   Review of Systems  Constitutional: Positive for activity change. Negative for fever, chills, diaphoresis and fatigue.       Unable to stretch and do activities because of back pain.   Cardiovascular: Negative for chest pain, palpitations and leg swelling.  Gastrointestinal: Negative for nausea, vomiting, diarrhea and constipation.  Musculoskeletal: Positive for myalgias, back pain, joint swelling, arthralgias, neck pain and neck stiffness.       Multiple neck surgeries and DDD, also states hips, shoulders hurt, and hands swell.   Skin: Negative for rash.   Past Medical History  Diagnosis Date  . Fibromyalgia 2009    Diagnosed by Dr. Loleta ChanceHill  . Migraines 2009    Evaluated by Dr. Loleta ChanceHill  . Gastric ulcer     As a child  . Hypertension 2002    Diagnosed by Dr. Hyacinth MeekerMiller    History   Social History  . Marital Status: Married    Spouse Name: Johnny    Number of Children: 3  . Years of Education: 14   Occupational History  . CNA     [employer]   Social History Main Topics  . Smoking status: Current Every Day Smoker -- 1.00 packs/day for 15 years    Types: Cigarettes  . Smokeless tobacco: Not on file  . Alcohol Use: No  . Drug Use: No  .  Sexual Activity: Yes   Other Topics Concern  . Not on file   Social History Narrative   Patient lives at home with her husband of 26 years, daughter and her fiance. 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 Patient has 3 adult children (2 sons, 1 dtr) and 2 grandchildren.     Past Surgical History  Procedure Laterality Date  . Cholecystectomy  2009  . Salpingoophorectomy Left 2004    left tube and ovary  . Cervical fusion  2009    Dr. Ophelia CharterYates    Family History  Problem Relation Age of Onset  . Diabetes Mother   . COPD Mother   . Hypothyroidism Mother   . Diabetes Father   . Hypertension Sister   . Diabetes Sister   . Seizures Sister   . COPD Sister   . Hypothyroidism Sister   . Diabetes Brother 65    Complications from uncontrolled diabetes  . Alcohol abuse Brother   . Seizures Daughter     Allergies  Allergen Reactions  . Aspirin   . Dilaudid [Hydromorphone Hcl] Nausea And Vomiting  . Promethazine Hcl Nausea And Vomiting    Current Outpatient Prescriptions on File Prior to Visit  Medication Sig Dispense Refill  . busPIRone (BUSPAR) 5 MG tablet Take 1 tablet (5 mg total) by mouth 2 (two) times daily. 60 tablet 3  . clonazePAM (KLONOPIN) 1 MG tablet TAKE ONE TABLET BY MOUTH ONCE DAILY AT BEDTIME AS NEEDED FOR ANXIETY 30 tablet 3  . cyclobenzaprine (FLEXERIL) 5 MG tablet Take 1 tablet (5 mg total) by mouth 3 (three) times daily as needed for muscle spasms. 30 tablet 1  . eletriptan (RELPAX) 40 MG tablet One tablet by mouth at onset of headache. May repeat in 2 hours if headache persists or recurs. 10 tablet 3  . estradiol (VIVELLE-DOT) 0.05 MG/24HR patch Place 1 patch (0.05 mg total) onto the skin 2 (two) times a week. 8 patch 5  . isometheptene-acetaminophen-dichloralphenazone (MIDRIN) 65-325-100 MG capsule TAKE 1 CAPSULE BY MOUTH 4 TIMES DAILY ASNEEDED FOR MIGRAINE. MAX OF 5CAPSULES IN 12 HOURS FOR MIGRAINE, 8 CAPSULES IN 24 HOURS FOR  TENSION 30 capsule 3  . metoCLOPramide (REGLAN) 5 MG tablet Take 5 mg by mouth daily as needed.    Maxwell Caul. Omeprazole-Sodium Bicarbonate (ZEGERID) 20-1100 MG CAPS Take 1 capsule by mouth daily.    . traMADol (ULTRAM) 50 MG tablet TAKE ONE (1) TABLET EVERY 6 HOURS AS NEEDED FOR MIGRAINES 30 tablet 2   No current facility-administered medications on file prior to visit.       Objective:   Physical Exam  Constitutional: She is oriented to person, place, and time. She appears well-developed and well-nourished. No distress.  HENT:  Head: Normocephalic and atraumatic.  Eyes: Conjunctivae and EOM are normal. Pupils are equal, round, and reactive to light. Right eye exhibits no discharge. Left eye exhibits no discharge. No scleral icterus.  Cardiovascular: Normal rate and regular rhythm.   Pulmonary/Chest: Effort normal and breath sounds normal.  Musculoskeletal: She exhibits tenderness. She exhibits no edema.  Neurological: She is alert and oriented to person, place, and time. She displays normal reflexes. No cranial nerve deficit. She exhibits abnormal muscle tone. Coordination abnormal.  Skin: Skin is warm and dry. No rash noted. She is not diaphoretic.  Psychiatric: She has a normal mood and affect. Her behavior is normal. Judgment and thought content normal.     BP 120/78 mmHg  Pulse 67  Temp(Src) 98.5 F (36.9 C) (Oral)  Resp 12  Ht 5' 2.5" (1.588 m)  Wt 137 lb 1.9 oz (62.197 kg)  BMI 24.66 kg/m2  SpO2 95%      Assessment & Plan:

## 2014-09-15 LAB — ANA: ANA: NEGATIVE

## 2014-09-15 LAB — CYCLIC CITRUL PEPTIDE ANTIBODY, IGG

## 2014-09-22 ENCOUNTER — Telehealth: Payer: Self-pay

## 2014-09-23 NOTE — Telephone Encounter (Signed)
Notified patient with lab results.  Patient verbalized understanding.

## 2014-10-12 ENCOUNTER — Ambulatory Visit: Payer: Self-pay | Admitting: Nurse Practitioner

## 2014-10-26 ENCOUNTER — Ambulatory Visit (INDEPENDENT_AMBULATORY_CARE_PROVIDER_SITE_OTHER): Payer: BLUE CROSS/BLUE SHIELD | Admitting: Nurse Practitioner

## 2014-10-26 ENCOUNTER — Encounter: Payer: Self-pay | Admitting: Nurse Practitioner

## 2014-10-26 VITALS — BP 126/80 | HR 70 | Temp 97.6°F | Resp 12 | Ht 62.5 in | Wt 138.8 lb

## 2014-10-26 DIAGNOSIS — G43009 Migraine without aura, not intractable, without status migrainosus: Secondary | ICD-10-CM

## 2014-10-26 DIAGNOSIS — M542 Cervicalgia: Secondary | ICD-10-CM

## 2014-10-26 MED ORDER — CLONAZEPAM 1 MG PO TABS: ORAL_TABLET | ORAL | Status: DC

## 2014-10-26 MED ORDER — ZOLMITRIPTAN 5 MG PO TABS: 5.0000 mg | ORAL_TABLET | ORAL | Status: AC | PRN

## 2014-10-26 MED ORDER — TRAMADOL HCL 50 MG PO TABS: ORAL_TABLET | ORAL | Status: DC

## 2014-10-26 NOTE — Patient Instructions (Signed)
We will contact you about your referral to Dr. Ollen BowlHarkins.

## 2014-10-26 NOTE — Progress Notes (Signed)
   Subjective:    Patient ID: Shelly Owens, female    DOB: 1965-11-13, 49 y.o.   MRN: [id number]  HPI  Shelly Owens is a 49 yo female with a CC of pain in neck and Migraines.  1) 2 weeks, on and off Headaches daily, ringing in ears, dots in eyes. No nausea or vomiting. She ran out of relpax and would like a refill. She states her migraines move in a predictable pattern and if she gets to the nausea and vomiting stage she usually goes to the ER.   2) Pain in neck. Pt is s/p cervical fusions. She was referred to Dr. Ollen BowlHarkins a pain specialist. I have asked their office about the referral and have yet to hear back.   Review of Systems  Constitutional: Negative for fever, chills, diaphoresis and fatigue.  Respiratory: Negative for chest tightness, shortness of breath and wheezing.   Cardiovascular: Negative for chest pain, palpitations and leg swelling.  Gastrointestinal: Negative for nausea, vomiting and diarrhea.  Musculoskeletal: Positive for arthralgias and neck pain.       ACDF's and posterior cervical fusions  Skin: Negative for rash.  Neurological: Negative for dizziness, weakness, numbness and headaches.  Psychiatric/Behavioral: The patient is not nervous/anxious.        Objective:   Physical Exam  Constitutional: She is oriented to person, place, and time. She appears well-developed and well-nourished. No distress.  BP 126/80 mmHg  Pulse 70  Temp(Src) 97.6 F (36.4 C) (Oral)  Resp 12  Ht 5' 2.5" (1.588 m)  Wt 138 lb 12.8 oz (62.959 kg)  BMI 24.97 kg/m2  SpO2 99%   HENT:  Head: Normocephalic and atraumatic.  Right Ear: External ear normal.  Left Ear: External ear normal.  Eyes: Right eye exhibits no discharge. Left eye exhibits no discharge. No scleral icterus.  Neck: Neck supple. No thyromegaly present.  Decreased ROM from fusions  Cardiovascular: Normal rate, regular rhythm, normal heart sounds and intact distal pulses.  Exam reveals no gallop and no friction rub.     No murmur heard. Pulmonary/Chest: Effort normal and breath sounds normal. No respiratory distress. She has no wheezes. She has no rales. She exhibits no tenderness.  Lymphadenopathy:    She has no cervical adenopathy.  Neurological: She is alert and oriented to person, place, and time. No cranial nerve deficit. She exhibits normal muscle tone. Coordination normal.  Skin: Skin is warm and dry. No rash noted. She is not diaphoretic.  Psychiatric: She has a normal mood and affect. Her behavior is normal. Judgment and thought content normal.      Assessment & Plan:

## 2014-10-26 NOTE — Progress Notes (Signed)
Pre visit review using our clinic review tool, if applicable. No additional management support is needed unless otherwise documented below in the visit note. 

## 2014-10-27 NOTE — Assessment & Plan Note (Addendum)
Stable. Following up on referral for Dr. Ollen BowlHarkins for pain management post neck fusions. FU prn. Refilled Tramadol 50 mg and Klonopin 1 mg tablets.

## 2014-10-27 NOTE — Assessment & Plan Note (Signed)
Relpax was no longer on formulary, switched to Zomig 5 mg once and can take again once at 2 hours. FU prn worsening/failure to improve.

## 2014-12-07 ENCOUNTER — Other Ambulatory Visit: Payer: Self-pay | Admitting: Nurse Practitioner

## 2014-12-07 NOTE — Telephone Encounter (Signed)
Last OV and refill 2.2.16.  Please advise refill. 

## 2014-12-07 NOTE — Telephone Encounter (Signed)
rx faxed

## 2015-01-14 ENCOUNTER — Other Ambulatory Visit: Payer: Self-pay | Admitting: Nurse Practitioner

## 2015-01-14 NOTE — Telephone Encounter (Signed)
Patient needs Rx refilled then faxed to [facility]. thanks

## 2015-01-14 NOTE — Telephone Encounter (Signed)
Called pharmacy.  Pt had rx on 12/07/14 for 30 tablets with 2 refills.  Should not need.  Spoke with pharmacist.  They missed refills.  Has two remaining.

## 2015-01-14 NOTE — Telephone Encounter (Signed)
Spoke with Patient and Rx refilled per Dr. Roby LoftsScott's phone call to the pharmacy with two prior refills present.

## 2015-03-04 ENCOUNTER — Other Ambulatory Visit: Payer: Self-pay | Admitting: Nurse Practitioner

## 2015-03-04 NOTE — Telephone Encounter (Signed)
Ok refill? Last visit 10/26/14

## 2015-03-04 NOTE — Telephone Encounter (Signed)
Faxed by Shawna Orleans.

## 2015-03-21 ENCOUNTER — Other Ambulatory Visit: Payer: Self-pay | Admitting: Nurse Practitioner

## 2015-03-21 NOTE — Telephone Encounter (Signed)
Last OV 2.2.16, last refill 12.27.15.  Please advise refill

## 2015-03-21 NOTE — Telephone Encounter (Signed)
Last OV 2.2.16, last refill 5.25.16.  Please advise refill

## 2015-03-22 ENCOUNTER — Ambulatory Visit (INDEPENDENT_AMBULATORY_CARE_PROVIDER_SITE_OTHER): Payer: Self-pay | Admitting: Nurse Practitioner

## 2015-03-22 VITALS — BP 144/100 | HR 76 | Temp 98.1°F | Resp 14 | Ht 62.5 in | Wt 138.2 lb

## 2015-03-22 DIAGNOSIS — G894 Chronic pain syndrome: Secondary | ICD-10-CM

## 2015-03-22 DIAGNOSIS — I10 Essential (primary) hypertension: Secondary | ICD-10-CM

## 2015-03-22 DIAGNOSIS — M542 Cervicalgia: Secondary | ICD-10-CM

## 2015-03-22 DIAGNOSIS — G43009 Migraine without aura, not intractable, without status migrainosus: Secondary | ICD-10-CM

## 2015-03-22 LAB — CBC WITH DIFFERENTIAL/PLATELET
Basophils Absolute: 0 K/uL (ref 0.0–0.1)
Basophils Relative: 0.5 % (ref 0.0–3.0)
Eosinophils Absolute: 0.1 K/uL (ref 0.0–0.7)
Eosinophils Relative: 0.8 % (ref 0.0–5.0)
HCT: 44.5 % (ref 36.0–46.0)
Hemoglobin: 14.8 g/dL (ref 12.0–15.0)
Lymphocytes Relative: 33.6 % (ref 12.0–46.0)
Lymphs Abs: 2.4 K/uL (ref 0.7–4.0)
MCHC: 33.2 g/dL (ref 30.0–36.0)
MCV: 94.6 fl (ref 78.0–100.0)
Monocytes Absolute: 0.5 K/uL (ref 0.1–1.0)
Monocytes Relative: 7.5 % (ref 3.0–12.0)
Neutro Abs: 4.2 K/uL (ref 1.4–7.7)
Neutrophils Relative %: 57.6 % (ref 43.0–77.0)
Platelets: 336 K/uL (ref 150.0–400.0)
RBC: 4.7 Mil/uL (ref 3.87–5.11)
RDW: 12.5 % (ref 11.5–15.5)
WBC: 7.2 K/uL (ref 4.0–10.5)

## 2015-03-22 LAB — COMPREHENSIVE METABOLIC PANEL WITH GFR
ALT: 23 U/L (ref 0–35)
AST: 27 U/L (ref 0–37)
Albumin: 4.4 g/dL (ref 3.5–5.2)
Alkaline Phosphatase: 73 U/L (ref 39–117)
BUN: 15 mg/dL (ref 6–23)
CO2: 25 meq/L (ref 19–32)
Calcium: 9.4 mg/dL (ref 8.4–10.5)
Chloride: 107 meq/L (ref 96–112)
Creatinine, Ser: 1.02 mg/dL (ref 0.40–1.20)
GFR: 61.34 mL/min
Glucose, Bld: 83 mg/dL (ref 70–99)
Potassium: 4.2 meq/L (ref 3.5–5.1)
Sodium: 141 meq/L (ref 135–145)
Total Bilirubin: 0.4 mg/dL (ref 0.2–1.2)
Total Protein: 7 g/dL (ref 6.0–8.3)

## 2015-03-22 LAB — TSH: TSH: 1.47 u[IU]/mL (ref 0.35–4.50)

## 2015-03-22 MED ORDER — HYDROCODONE-ACETAMINOPHEN 5-325 MG PO TABS: 1.0000 | ORAL_TABLET | Freq: Four times a day (QID) | ORAL | Status: AC | PRN

## 2015-03-22 MED ORDER — TRAMADOL HCL 50 MG PO TABS: 50.0000 mg | ORAL_TABLET | Freq: Two times a day (BID) | ORAL | Status: DC

## 2015-03-22 NOTE — Progress Notes (Signed)
Subjective:    Patient ID: Shelly Owens, female    DOB: [birth year], 49 y.o.   MRN: [id number]  HPI  Shelly Owens is a 49 yo female with a CC of chronic pain.  1) Out of the Noroc.   Yesterday took 1 am and 1 pm out of tramadol as well.  Tramadol- once every morning, neck and back pain still incapacitating pt. She is accompanied by her husband today.   2) Swelling, drinking 5 bottles or more of water, no output as much    1 cup of coffee in the morning, herbal teas  3)Migraines- daily   Review of Systems  Constitutional: Positive for fatigue. Negative for fever, chills and diaphoresis.  HENT: Negative for tinnitus and trouble swallowing.   Eyes: Negative for visual disturbance.  Respiratory: Negative for chest tightness, shortness of breath and wheezing.   Cardiovascular: Positive for leg swelling. Negative for chest pain and palpitations.  Gastrointestinal: Negative for nausea, vomiting and diarrhea.  Musculoskeletal: Positive for back pain, arthralgias and neck pain. Negative for myalgias, joint swelling, gait problem and neck stiffness.  Skin: Negative for rash.  Neurological: Positive for headaches. Negative for dizziness, weakness and numbness.  Psychiatric/Behavioral: Positive for sleep disturbance. Negative for suicidal ideas. The patient is nervous/anxious.       Objective:   Physical Exam  Constitutional: She is oriented to person, place, and time. She appears well-developed and well-nourished. No distress.  BP 144/100 mmHg  Pulse 76  Temp(Src) 98.1 F (36.7 C)  Resp 14  Ht 5' 2.5" (1.588 m)  Wt 138 lb 3.2 oz (62.687 kg)  BMI 24.86 kg/m2  SpO2 96%   HENT:  Head: Normocephalic and atraumatic.  Right Ear: External ear normal.  Left Ear: External ear normal.  Cardiovascular: Normal rate, regular rhythm, normal heart sounds and intact distal pulses.  Exam reveals no gallop and no friction rub.   No murmur heard. Pulmonary/Chest: Effort normal and breath sounds  normal. No respiratory distress. She has no wheezes. She has no rales. She exhibits no tenderness.  Musculoskeletal: She exhibits tenderness. She exhibits no edema.       Cervical back: She exhibits decreased range of motion, tenderness, pain and spasm. She exhibits no bony tenderness, no swelling, no edema, no deformity, no laceration and normal pulse.       Thoracic back: She exhibits decreased range of motion, tenderness, bony tenderness, pain and spasm. She exhibits no swelling, no edema, no deformity, no laceration and normal pulse.       Lumbar back: She exhibits decreased range of motion, tenderness, bony tenderness, pain and spasm. She exhibits no swelling, no edema, no deformity, no laceration and normal pulse.       Left upper leg: Normal.       Right lower leg: She exhibits tenderness and swelling.       Left lower leg: She exhibits tenderness and swelling.       Right foot: There is tenderness and swelling. There is normal capillary refill.       Left foot: There is tenderness and swelling. There is normal capillary refill.  Neurological: She is alert and oriented to person, place, and time. No cranial nerve deficit. She exhibits normal muscle tone. Coordination normal.  Skin: Skin is warm and dry. No rash noted. She is not diaphoretic.  Psychiatric: Judgment and thought content normal. Her mood appears anxious. Her affect is not angry, not blunt, not labile and not inappropriate. Her speech  is delayed. Her speech is not rapid and/or pressured, not tangential and not slurred. She is slowed. She exhibits a depressed mood. She is communicative.      Assessment & Plan:

## 2015-03-22 NOTE — Patient Instructions (Addendum)
We will contact you about your referral to Dr. Yves Dillhasnis   Follow up in 3 months.   If potassium/kidneys look good we will send in HCTZ- diuretic/BP pill

## 2015-03-22 NOTE — Progress Notes (Signed)
Pre visit review using our clinic review tool, if applicable. No additional management support is needed unless otherwise documented below in the visit note. 

## 2015-03-29 ENCOUNTER — Ambulatory Visit: Payer: BLUE CROSS/BLUE SHIELD | Admitting: Nurse Practitioner

## 2015-04-03 ENCOUNTER — Encounter: Payer: Self-pay | Admitting: Nurse Practitioner

## 2015-04-03 DIAGNOSIS — I10 Essential (primary) hypertension: Secondary | ICD-10-CM | POA: Insufficient documentation

## 2015-04-03 DIAGNOSIS — G894 Chronic pain syndrome: Secondary | ICD-10-CM | POA: Insufficient documentation

## 2015-04-03 MED ORDER — HYDROCHLOROTHIAZIDE 12.5 MG PO TABS: 12.5000 mg | ORAL_TABLET | Freq: Every day | ORAL | Status: AC

## 2015-04-03 NOTE — Assessment & Plan Note (Signed)
HTN due to pain. Will check CMET and cbc w/ diff as well as TSH today. Will call in HCTZ if k+ is normal.

## 2015-04-03 NOTE — Assessment & Plan Note (Signed)
Patient has elevated BP today pt is visibly in pain. She is making small movements and is teary on exam. She reports not hearing from Dr. Ollen BowlHarkins' office and will refer to Dr. Yves Dillhasnis for care. Due to her history of multiple surgeries on her cervical spine and her history of migraines she is taking hydrocodone-vicodin for severe pain, tramadol daily for moderate pain, and flexeril for muscle spasms. Discussed getting her off of these medications and pt is on board. She is compliant with CSC, UDS, and on the NCCSRS. Will follow.

## 2015-07-20 ENCOUNTER — Other Ambulatory Visit: Payer: Self-pay | Admitting: Nurse Practitioner

## 2015-07-20 NOTE — Telephone Encounter (Signed)
Please advise refill? 

## 2015-07-20 NOTE — Telephone Encounter (Signed)
Last Ov was 7.5.16 was cancelled, last time she was seen was 6.28.16, no OV scheduled. Please advise refill

## 2015-07-26 ENCOUNTER — Telehealth: Payer: Self-pay | Admitting: Nurse Practitioner

## 2015-07-26 ENCOUNTER — Other Ambulatory Visit: Payer: Self-pay | Admitting: Nurse Practitioner

## 2015-07-26 NOTE — Telephone Encounter (Signed)
Pt called stating that she needs refills on the following medications zolmitriptan (ZOMIG) 5 MG tablet ,clonazePAM (KLONOPIN) 1 MG tablet,HYDROcodone-acetaminophen (NORCO/VICODIN) 5-325 MG per tablet and traMADol (ULTRAM) 50 MG tablet sent to  [facility] - [place], [place] - [address] Kitchen. Please advise pt..Marland Kitchen

## 2015-07-26 NOTE — Telephone Encounter (Signed)
Please advise refill? 

## 2015-07-26 NOTE — Telephone Encounter (Signed)
Sent to provider for review

## 2015-07-27 ENCOUNTER — Encounter: Payer: Self-pay | Admitting: Nurse Practitioner

## 2015-07-27 ENCOUNTER — Other Ambulatory Visit: Payer: Self-pay

## 2015-07-27 ENCOUNTER — Ambulatory Visit (INDEPENDENT_AMBULATORY_CARE_PROVIDER_SITE_OTHER): Payer: Self-pay | Admitting: Nurse Practitioner

## 2015-07-27 VITALS — BP 138/100 | HR 81 | Temp 98.7°F | Resp 14 | Ht 67.0 in | Wt 142.8 lb

## 2015-07-27 DIAGNOSIS — I10 Essential (primary) hypertension: Secondary | ICD-10-CM

## 2015-07-27 DIAGNOSIS — G43009 Migraine without aura, not intractable, without status migrainosus: Secondary | ICD-10-CM

## 2015-07-27 DIAGNOSIS — Z91199 Patient's noncompliance with other medical treatment and regimen due to unspecified reason: Secondary | ICD-10-CM

## 2015-07-27 DIAGNOSIS — G894 Chronic pain syndrome: Secondary | ICD-10-CM

## 2015-07-27 DIAGNOSIS — Z9119 Patient's noncompliance with other medical treatment and regimen: Secondary | ICD-10-CM

## 2015-07-27 MED ORDER — ISOMETHEPTENE-DICHLORAL-APAP 65-100-325 MG PO CAPS: 1.0000 | ORAL_CAPSULE | Freq: Four times a day (QID) | ORAL | Status: DC | PRN

## 2015-07-27 MED ORDER — TRAMADOL HCL 50 MG PO TABS: 50.0000 mg | ORAL_TABLET | Freq: Two times a day (BID) | ORAL | Status: AC

## 2015-07-27 MED ORDER — CLONAZEPAM 1 MG PO TABS: ORAL_TABLET | ORAL | Status: DC

## 2015-07-27 MED ORDER — CLONAZEPAM 1 MG PO TABS: ORAL_TABLET | ORAL | Status: AC

## 2015-07-27 MED ORDER — ISOMETHEPTENE-DICHLORAL-APAP 65-100-325 MG PO CAPS: 1.0000 | ORAL_CAPSULE | Freq: Four times a day (QID) | ORAL | Status: AC | PRN

## 2015-07-27 MED ORDER — TRAMADOL HCL 50 MG PO TABS: 50.0000 mg | ORAL_TABLET | Freq: Two times a day (BID) | ORAL | Status: DC

## 2015-07-27 NOTE — Assessment & Plan Note (Addendum)
Patient is non-compliant for follow up and other referrals to pain management specialists. I have written for Tramadol, Klonopin, and Midrin for 3 months. She has cancelled and no-showed to appointments in the past. She did not respond to the last 2 pain management referrals. She reports her insurance lapsed and she could not afford specialty care, however she has her nails and toenails professionally and she reports she is going on a vacation to [place] for 1 week next week.

## 2015-07-27 NOTE — Progress Notes (Signed)
Patient ID: Shelly Owens, female    DOB: 1965/12/28  Age: 49 y.o. MRN: [id number]  CC: Medication Refill   HPI LLOYD CULLINAN presents for medication refills.   1) Pt presents today upset because her pharmacy would not send [place] prescription refills (per pt). I explained to her that she was not allowed to have refills if she did not follow up.   When asked why she was not responding to phone calls from Dr. Yves Dill' office to set up an appointment she replied she couldn't go to a specialist because there was a lapse in insurance for non-payment and it is embarrassing.   She did not follow up with myself in 3 months from last visit because she reportedly did not know, however the appointment for 1 month follow up in July was cancelled by patient.   She reports needing Klonopin since she took her last dosage yesterday. She also asks for Midrin and Tramadol. She is having treatments reportedly with a Dr. Mayford Knife at a Chiropractic office. She is receiving care through them with Lasers (?), TENS unit, and decompression of lumbar spine.   She is leaving for [place] next week for 1 week and has a list of restricted activities. She reports the x-ray showed what she described as lumbar scoliosis. She reports it is helpful for her fibromyalgia only.   Patient not interested in stopping smoking. She reports after the Chiropractor has her go through rehabilitation she will work on this. She is not interested in having the flu vaccine because she is concerned it will cause nausea. She reports she is taking her BP medication and it is up because she is out of medication.   History Breanda has a past medical history of Fibromyalgia (2009); Migraines (2009); Gastric ulcer; and Hypertension (2002).   She has past surgical history that includes Cholecystectomy (2009); Salpingoophorectomy (Left, 2004); and Cervical fusion (2009).   Her family history includes Alcohol abuse in her brother; COPD in her mother and  sister; Diabetes in her father, mother, and sister; Diabetes (age of onset: 29) in her brother; Hypertension in her sister; Hypothyroidism in her mother and sister; Seizures in her daughter and sister.She reports that she has been smoking Cigarettes.  She has a 15 pack-year smoking history. She does not have any smokeless tobacco history on file. She reports that she does not drink alcohol or use illicit drugs.  Outpatient Prescriptions Prior to Visit  Medication Sig Dispense Refill  . busPIRone (BUSPAR) 5 MG tablet Take 1 tablet (5 mg total) by mouth 2 (two) times daily. 60 tablet 3  . cyclobenzaprine (FLEXERIL) 5 MG tablet Take 1 tablet (5 mg total) by mouth 3 (three) times daily as needed for muscle spasms. 30 tablet 1  . estradiol (VIVELLE-DOT) 0.05 MG/24HR patch Place 1 patch (0.05 mg total) onto the skin 2 (two) times a week. 8 patch 5  . hydrochlorothiazide (HYDRODIURIL) 12.5 MG tablet Take 1 tablet (12.5 mg total) by mouth daily. 90 tablet 3  . HYDROcodone-acetaminophen (NORCO/VICODIN) 5-325 MG per tablet Take 1 tablet by mouth every 6 (six) hours as needed for moderate pain. 60 tablet 0  . metoCLOPramide (REGLAN) 5 MG tablet Take 5 mg by mouth daily as needed.    Maxwell Caul Bicarbonate (ZEGERID) 20-1100 MG CAPS Take 1 capsule by mouth daily.    Marland Kitchen zolmitriptan (ZOMIG) 5 MG tablet Take 1 tablet (5 mg total) by mouth as needed for migraine. Take 1 tablet by mouth as needed  for migraine may repeat once 2 hours after first dose. 10 tablet 0  . clonazePAM (KLONOPIN) 1 MG tablet TAKE ONE (1) TABLET AT BEDTIME AS NEEDEDFOR ANXIETY 30 tablet 3  . isometheptene-acetaminophen-dichloralphenazone (MIDRIN) 65-325-100 MG capsule TAKE 1 CAPSULE BY MOUTH 4 TIMES DAILY ASNEEDED FOR MIGRAINE. MAX OF 5CAPSULES IN 12 HOURS FOR MIGRAINE, 8 CAPSULES IN 24 HOURS FOR TENSION 30 capsule 3  . traMADol (ULTRAM) 50 MG tablet Take 1 tablet (50 mg total) by mouth 2 (two) times daily. 60 tablet 2   No  facility-administered medications prior to visit.    ROS Review of Systems  Constitutional: Negative for fever, chills, diaphoresis and fatigue.  Respiratory: Negative for chest tightness, shortness of breath and wheezing.   Cardiovascular: Negative for chest pain, palpitations and leg swelling.  Gastrointestinal: Negative for nausea, vomiting and diarrhea.  Musculoskeletal: Positive for myalgias and arthralgias.  Skin: Negative for rash.  Neurological: Negative for dizziness, weakness, numbness and headaches.  Psychiatric/Behavioral: The patient is nervous/anxious.     Objective:  BP 138/100 mmHg  Pulse 81  Temp(Src) 98.7 F (37.1 C)  Resp 14  Ht  (1.702 m)  Wt 142 lb 12.8 oz (64.774 kg)  BMI 22.36 kg/m2  SpO2 97%  Physical Exam  Constitutional: She is oriented to person, place, and time. She appears well-developed and well-nourished. No distress.  HENT:  Head: Normocephalic and atraumatic.  Right Ear: External ear normal.  Left Ear: External ear normal.  Neurological: She is alert and oriented to person, place, and time. No cranial nerve deficit. She exhibits normal muscle tone. Coordination normal.  Skin: Skin is warm and dry. No rash noted. She is not diaphoretic.  Psychiatric: She has a normal mood and affect. Her behavior is normal. Judgment and thought content normal.  Pt appears anxious and alert, but not in pain at today's visit   Assessment & Plan:   Nasiya was seen today for medication refill.  Diagnoses and all orders for this visit:  Non compliance with medical treatment  Benign essential HTN  Migraine without aura and without status migrainosus, not intractable  Chronic pain syndrome  Other orders -     Discontinue: isometheptene-acetaminophen-dichloralphenazone (MIDRIN) 65-100-325 MG capsule; Take 1 capsule by mouth 4 (four) times daily as needed for migraine. Maximum 5 capsules in 12 hours for migraine headaches, 8 capsules in 24 hours for  tension headaches. -     Discontinue: traMADol (ULTRAM) 50 MG tablet; Take 1 tablet (50 mg total) by mouth 2 (two) times daily. -     Discontinue: clonazePAM (KLONOPIN) 1 MG tablet; TAKE ONE (1) TABLET AT BEDTIME AS NEEDEDFOR ANXIETY   I have discontinued Ms. Voyles's isometheptene-acetaminophen-dichloralphenazone, clonazePAM, and traMADol. I am also having her maintain her metoCLOPramide, Omeprazole-Sodium Bicarbonate, cyclobenzaprine, estradiol, busPIRone, zolmitriptan, HYDROcodone-acetaminophen, and hydrochlorothiazide.  Meds ordered this encounter  Medications  . DISCONTD: isometheptene-acetaminophen-dichloralphenazone (MIDRIN) 65-100-325 MG capsule    Sig: Take 1 capsule by mouth 4 (four) times daily as needed for migraine. Maximum 5 capsules in 12 hours for migraine headaches, 8 capsules in 24 hours for tension headaches.    Dispense:  30 capsule    Refill:  2    Order Specific Question:  Supervising Provider    Answer:  Duncan Dull L [[id number]]  . DISCONTD: traMADol (ULTRAM) 50 MG tablet    Sig: Take 1 tablet (50 mg total) by mouth 2 (two) times daily.    Dispense:  60 tablet    Refill:  2  Order Specific Question:  Supervising Provider    Answer:  Sherlene ShamsULLO, TERESA L [[id number]]  . DISCONTD: clonazePAM (KLONOPIN) 1 MG tablet    Sig: TAKE ONE (1) TABLET AT BEDTIME AS NEEDEDFOR ANXIETY    Dispense:  30 tablet    Refill:  3    Order Specific Question:  Supervising Provider    Answer:  Sherlene ShamsULLO, TERESA L [[id number]]     Follow-up: Return in about 3 months (around 10/27/2015) for Medication refills.

## 2015-07-27 NOTE — Telephone Encounter (Signed)
Please advise patient that they will need to be seen prior to any refills.

## 2015-07-27 NOTE — Patient Instructions (Addendum)
Please follow up in 3 months or your medication refills will be denied.   Have Dr. Mayford KnifeWilliams send us imagining information for your back and notes for treatment plan.

## 2015-07-27 NOTE — Assessment & Plan Note (Addendum)
Taking Midrin for headaches. Helpful. Refilled today

## 2015-07-27 NOTE — Telephone Encounter (Signed)
Pt called about her prescription. I advised that it's waiting for the Doctor to review. Thank You!

## 2015-07-27 NOTE — Assessment & Plan Note (Signed)
Pt did not follow up. She is reportedly taking her medication.

## 2015-07-27 NOTE — Telephone Encounter (Signed)
Pt is coming in today. Thanks!

## 2015-07-27 NOTE — Assessment & Plan Note (Signed)
Pt is not wanting to be referred any longer to pain management. I discussed that I cannot continue past this next three months to write for her due to lack of evidence because she is non-compliant with follow up, imaging, and referrals for pain management. She is currently seeing a Chiropractor for care and reports this is helpful. She looks to not be in pain today, but is concerned about withdrawal from benzos.

## 2015-07-27 NOTE — Progress Notes (Signed)
Pre visit review using our clinic review tool, if applicable. No additional management support is needed unless otherwise documented below in the visit note.
